# Patient Record
Sex: Female | Born: 1980 | Race: White | Hispanic: No | Smoking: Current every day smoker
Health system: Southern US, Community
[De-identification: ages and names within clinical notes are randomized; demographics above are authoritative.]

## PROBLEM LIST (undated history)

## (undated) HISTORY — PX: TUBAL LIGATION: SHX77

---

## 2018-08-30 NOTE — Progress Notes (Signed)
Patient's Name: Anne Chandler  MRN: [id number]  Referring Provider: Lysbeth Penner, FNP  DOB: 07-Aug-1980  PCP: Liliane Shi, MD  DOS: 08/31/2018  Note by: Anne Santa, MD  Service setting: Ambulatory outpatient  Specialty: Interventional Pain Management  Location: [facility] (AMB) Pain Management Facility  Visit type: Initial Patient Evaluation  Patient type: New Patient   Primary Reason(s) for Visit: Encounter for initial evaluation of one or more chronic problems (new to examiner) potentially causing chronic pain, and posing a threat to normal musculoskeletal function. (Level of risk: High) CC: Back Pain  HPI  Anne Chandler is a 38 y.o. year old, female patient, who comes today to see [place] for the first time for an initial evaluation of her chronic pain. She does not have a problem list on file. Today she comes in for evaluation of her Back Pain  Pain Assessment: Location:   Back Radiating: Pain radiaties down left to foot Onset: More than a month ago Duration: Chronic pain Quality: Tingling, Aching, Burning, Shooting, Numbness, Throbbing Severity: 3 /10 (subjective, self-reported pain score)  Note: Reported level is compatible with observation.                         When using our objective Pain Scale, levels between 6 and 10/10 are said to belong in an emergency room, as it progressively worsens from a 6/10, described as severely limiting, requiring emergency care not usually available at an outpatient pain management facility. At a 6/10 level, communication becomes difficult and requires great effort. Assistance to reach the emergency department may be required. Facial flushing and profuse sweating along with potentially dangerous increases in heart rate and blood pressure will be evident. Effect on ADL: pain cause me to stop doing anything Timing: Constant Modifying factors: steroid by mouth, ice and heat BP: 124/77  HR: (!) 105  Onset and Duration: Gradual and Date of onset: more than  1 month Cause of pain: Unknown Severity: Getting worse, NAS-11 at its worse: 10/10, NAS-11 at its best: 2/10, NAS-11 now: 3/10 and NAS-11 on the average: 3/10 Timing: Not influenced by the time of the day Aggravating Factors: Bending, Lifiting, Prolonged sitting, Prolonged standing and Walking Alleviating Factors: Cold packs, Medications and Warm showers or baths Associated Problems: Depression, Fatigue, Numbness, Tingling, Pain that wakes patient up and Pain that does not allow patient to sleep Quality of Pain: Burning, Constant, Sharp, Shooting and Uncomfortable Previous Examinations or Tests: MRI scan Previous Treatments: Epidural steroid injections, Narcotic medications and Steroid treatments by mouth  The patient comes into the clinics today for the first time for a chronic pain management evaluation.   38 year old female with a past medical history of hepatitis C presents with a chief complaint of low back pain with radiation down to her left lower extremity.  She finds it difficult to walk.  No inciting event.  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  She does not remember any sort of lifting trauma.  Patient's lumbar MRI shows L4-L5 left paracentral disc extrusion with cephalad migration of disc material and mass-effect on the left intraspinal L4 and L5 nerve roots.  Also shows mild bilateral facet arthropathy as well as left lateral recess stenosis.  Moderate foraminal stenosis.  She has been evaluated by urgent care.  She has received 2 steroid prednisone tapers.  She states that these prednisone tapers are very effective in decreasing her pain  and allowing her to function.  Is been 2 days since she has finished her steroid taper she states that her symptoms are returning.  She is being referred here for epidural steroid injection.  Patient states that she is also been utilizing oxycodone to help her breakthrough pain but it is not very effective.   I informed her that I do not plan on prescribing this medication given that long-term opioid therapy is not effective for acute radicular pain.  Denies any bowel or bladder dysfunction.  Denies physical therapy.  We discussed physical therapy, starting a membrane stabilizer and a diagnostic epidural steroid injection.  Patient in agreement with plan.   Historic Controlled Substance Pharmacotherapy Review   08/25/2018  1   08/25/2018  Oxycodon-Acetaminophen 7.5-325  20.00 3 [place] Ox[place]   [id number]   [place] ([id number])   0  75.00 MME  Medicaid   [place]   Short supply of oxycodone filled by urgent care, quantity 20 for acute radicular pain.    Historical Background Evaluation: [place] PMP: PDMP reviewed during this encounter. Six (6) year initial data search conducted.              [place] Department of public safety, offender search: Engineer, mining Information) Non-contributory Risk Assessment Profile: Aberrant behavior: None observed or detected today Risk factors for fatal opioid overdose: None identified today Fatal overdose hazard ratio (HR): Calculation deferred Non-fatal overdose hazard ratio (HR): Calculation deferred Risk of opioid abuse or dependence: 0.7-3.0% with doses ? 36 MME/day and 6.1-26% with doses ? 120 MME/day. Substance use disorder (SUD) risk level: See below Personal History of Substance Abuse (SUD-Substance use disorder):  Alcohol:    Illegal Drugs:    Rx Drugs:    ORT Risk Level calculation:    ORT Scoring interpretation table:  Score <3 = Low Risk for SUD  Score between 4-7 = Moderate Risk for SUD  Score >8 = High Risk for Opioid Abuse   PHQ-2 Depression Scale:  Total score:    PHQ-2 Scoring interpretation table: (Score and probability of major depressive disorder)  Score 0 = No depression  Score 1 = 15.4% Probability  Score 2 = 21.1% Probability  Score 3 = 38.4% Probability  Score 4 = 45.5% Probability  Score 5 = 56.4% Probability  Score 6 = 78.6% Probability   PHQ-9 Depression Scale:   Total score:    PHQ-9 Scoring interpretation table:  Score 0-4 = No depression  Score 5-9 = Mild depression  Score 10-14 = Moderate depression  Score 15-19 = Moderately severe depression  Score 20-27 = Severe depression (2.4 times higher risk of SUD and 2.89 times higher risk of overuse)   Pharmacologic Plan: No opioid analgesics.             Meds   Current Outpatient Medications:  .  oxycodone-acetaminophen (LYNOX) 7.5-300 MG tablet, Take 1 tablet by mouth every 6 (six) hours as needed for pain., Disp: , Rfl:  .  gabapentin (NEURONTIN) 300 MG capsule, 300 mg qhs for 5 days, if not side effects, increase to BID, Disp: 90 capsule, Rfl: 2 .  methylPREDNISolone (MEDROL) 4 MG TBPK tablet, Follow package instructions., Disp: 21 tablet, Rfl: 0  Imaging Review   Lumbar MRI 08/04/2018: Impression: 1.  At L4-5 there is a broad-based disc bulge with left paracentral disc extrusion with cephalad migration of disc material and mass-effect upon the left intraspinal L4-L5 nerve roots.  Mild bilateral facet arthropathy.  Left lateral recess stenosis.  Moderate left  foraminal stenosis.  Moderate foraminal stenosis.  Mild spinal stenosis. 2.  At L5-S1 there is a broad-based disc bulge with a small central disc protrusion.  Bilateral lateral recess narrowing and moderate right foraminal stenosis. 3.  At L3-L4 there is mild broad-based disc bulge with mild bilateral facet arthropathy and bilateral recess narrowing.  Complexity Note: Imaging results reviewed. Results shared with Anne Chandler, using Layman's terms.                         ROS  Cardiovascular: No reported cardiovascular signs or symptoms such as High blood pressure, coronary artery disease, abnormal heart rate or rhythm, heart attack, blood thinner therapy or heart weakness and/or failure Pulmonary or Respiratory: Smoking and Temporary stoppage of breathing during sleep Neurological: No reported neurological signs or symptoms such as seizures,  abnormal skin sensations, urinary and/or fecal incontinence, being born with an abnormal open spine and/or a tethered spinal cord Review of Past Neurological Studies: No results found for this or any previous visit. Psychological-Psychiatric: Depressed Gastrointestinal: No reported gastrointestinal signs or symptoms such as vomiting or evacuating blood, reflux, heartburn, alternating episodes of diarrhea and constipation, inflamed or scarred liver, or pancreas or irrregular and/or infrequent bowel movements Genitourinary: No reported renal or genitourinary signs or symptoms such as difficulty voiding or producing urine, peeing blood, non-functioning kidney, kidney stones, difficulty emptying the bladder, difficulty controlling the flow of urine, or chronic kidney disease Hematological: No reported hematological signs or symptoms such as prolonged bleeding, low or poor functioning platelets, bruising or bleeding easily, hereditary bleeding problems, low energy levels due to low hemoglobin or being anemic Endocrine: No reported endocrine signs or symptoms such as high or low blood sugar, rapid heart rate due to high thyroid levels, obesity or weight gain due to slow thyroid or thyroid disease Rheumatologic: No reported rheumatological signs and symptoms such as fatigue, joint pain, tenderness, swelling, redness, heat, stiffness, decreased range of motion, with or without associated rash Musculoskeletal: Negative for myasthenia gravis, muscular dystrophy, multiple sclerosis or malignant hyperthermia Work History: Working full time  Allergies  Anne Chandler has no allergies on file.   PFSH  Drug: Anne Chandler  has no history on file for drug. Alcohol:  has no history on file for alcohol. Tobacco:  reports that she has been smoking cigarettes. She uses smokeless tobacco. Medical:  has no past medical history on file. Family: family history is not on file.  Past Surgical History:  Procedure Laterality  Date  . TUBAL LIGATION     Active Ambulatory Problems    Diagnosis Date Noted  . No Active Ambulatory Problems   Resolved Ambulatory Problems    Diagnosis Date Noted  . No Resolved Ambulatory Problems   No Additional Past Medical History   Constitutional Exam  General appearance: Well nourished, well developed, and well hydrated. In no apparent acute distress Vitals:   08/31/18 1344  BP: 124/77  Pulse: (!) 105  Temp: 98.4 F (36.9 C)  SpO2: 99%  Weight: 165 lb (74.8 kg)  Height: 6' (1.829 m)   BMI Assessment: Estimated body mass index is 22.38 kg/m as calculated from the following:   Height as of this encounter: 6' (1.829 m).   Weight as of this encounter: 165 lb (74.8 kg).  BMI interpretation table: BMI level Category Range association with higher incidence of chronic pain  <18 kg/m2 Underweight   18.5-24.9 kg/m2 Ideal body weight   25-29.9 kg/m2 Overweight Increased incidence  by 20%  30-34.9 kg/m2 Obese (Class I) Increased incidence by 68%  35-39.9 kg/m2 Severe obesity (Class II) Increased incidence by 136%  >40 kg/m2 Extreme obesity (Class III) Increased incidence by 254%   Patient's current BMI Ideal Body weight  Body mass index is 22.38 kg/m. Ideal body weight: 73.1 kg (161 lb 2.5 oz) Adjusted ideal body weight: 73.8 kg (162 lb 11.1 oz)   BMI Readings from Last 4 Encounters:  08/31/18 22.38 kg/m   Wt Readings from Last 4 Encounters:  08/31/18 165 lb (74.8 kg)  Psych/Mental status: Alert, oriented x 3 (person, place, & time)       Eyes: PERLA Respiratory: No evidence of acute respiratory distress  Cervical Spine Area Exam  Skin & Axial Inspection: No masses, redness, edema, swelling, or associated skin lesions Alignment: Symmetrical Functional ROM: Unrestricted ROM      Stability: No instability detected Muscle Tone/Strength: Functionally intact. No obvious neuro-muscular anomalies detected. Sensory (Neurological): Unimpaired  Thoracic Spine Area Exam   Skin & Axial Inspection: No masses, redness, or swelling Alignment: Symmetrical Functional ROM: Unrestricted ROM Stability: No instability detected Muscle Tone/Strength: Functionally intact. No obvious neuro-muscular anomalies detected. Sensory (Neurological): Unimpaired Muscle strength & Tone: No palpable anomalies  Lumbar Spine Area Exam  Skin & Axial Inspection: No masses, redness, or swelling Alignment: Symmetrical Functional ROM: Decreased ROM affecting primarily the left Stability: No instability detected Muscle Tone/Strength: Functionally intact. No obvious neuro-muscular anomalies detected. Sensory (Neurological): Dermatomal pain pattern left L5, partial L4  Provocative Tests: Hyperextension/rotation test: (+) bilaterally for facet joint pain. Lumbar quadrant test (Kemp's test): (+) on the left for foraminal stenosis Lateral bending test: (+) ipsilateral radicular pain, on the left. Positive for left-sided foraminal stenosis. Patrick's Maneuver: deferred today                   FABER* test: deferred today                   S-I anterior distraction/compression test: deferred today         S-I lateral compression test: deferred today         S-I Thigh-thrust test: deferred today         S-I Gaenslen's test: deferred today         *(Flexion, ABduction and External Rotation)  Gait & Posture Assessment  Ambulation: Unassisted Gait: Relatively normal for age and body habitus Posture: WNL   Lower Extremity Exam    Side: Right lower extremity  Side: Left lower extremity  Stability: No instability observed          Stability: No instability observed          Skin & Extremity Inspection: Skin color, temperature, and hair growth are WNL. No peripheral edema or cyanosis. No masses, redness, swelling, asymmetry, or associated skin lesions. No contractures.  Skin & Extremity Inspection: Skin color, temperature, and hair growth are WNL. No peripheral edema or cyanosis. No masses,  redness, swelling, asymmetry, or associated skin lesions. No contractures.  Functional ROM: Unrestricted ROM                  Functional ROM: Pain restricted ROM for hip and knee joints          Muscle Tone/Strength: Functionally intact. No obvious neuro-muscular anomalies detected.  Muscle Tone/Strength: Functionally intact. No obvious neuro-muscular anomalies detected.  Sensory (Neurological): Unimpaired        Sensory (Neurological): Dermatomal pain pattern: L4-L5  DTR: Patellar: 2+: normal Achilles: deferred today Plantar: deferred today  DTR: Patellar: 2+: normal Achilles: deferred today Plantar: deferred today  Palpation: No palpable anomalies  Palpation: No palpable anomalies   Assessment  Primary Diagnosis & Pertinent Problem List: The primary encounter diagnosis was Lumbar radiculopathy. A diagnosis of Lumbar radicular syndrome was also pertinent to this visit.  Visit Diagnosis (New problems to examiner): 1. Lumbar radiculopathy   2. Lumbar radicular syndrome    Plan of Care (Initial workup plan)  General Recommendations: The pain condition that the patient suffers from is best treated with a multidisciplinary approach that involves an increase in physical activity to prevent de-conditioning and worsening of the pain cycle, as well as psychological counseling (formal and/or informal) to address the co-morbid psychological affects of pain. Treatment will often involve judicious use of pain medications and interventional procedures to decrease the pain, allowing the patient to participate in the physical activity that will ultimately produce long-lasting pain reductions. The goal of the multidisciplinary approach is to return the patient to a higher level of overall function and to restore their ability to perform activities of daily living.  38 year old female with a past medical history of hepatitis C presents with a chief complaint of low back pain with radiation down to her  left lower extremity.  She finds it difficult to walk.  No inciting event.  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  She does not remember any sort of lifting trauma.  Patient's lumbar MRI shows L4-L5 left paracentral disc extrusion with cephalad migration of disc material and mass-effect on the left intraspinal L4 and L5 nerve roots.  Also shows mild bilateral facet arthropathy as well as left lateral recess stenosis.  Moderate foraminal stenosis.  She has been evaluated by urgent care.  She has received 2 steroid prednisone tapers.  She states that these prednisone tapers are very effective in decreasing her pain and allowing her to function.  Is been 2 days since she has finished her steroid taper she states that her symptoms are returning.  She is being referred here for epidural steroid injection.  Patient states that she is also been utilizing oxycodone to help her breakthrough pain but it is not very effective.  I informed her that I do not plan on prescribing this medication given that long-term opioid therapy is not effective for acute radicular pain.  Denies any bowel or bladder dysfunction.  Denies physical therapy.  We discussed physical therapy, starting a membrane stabilizer and a diagnostic epidural steroid injection.  Patient in agreement with plan.  Plan: -Had extensive discussion with patient regarding evidence-based therapies for acute radiculopathy.  I informed the patient that these usually tend to get better on their own.  Physical therapy can be helpful.  I will place a referral.  We also discussed the addition of a neuropathic membrane stabilizer.  Patient denies having been on gabapentin.  We will start as below.  Patient is also requesting a steroid taper.  She is already had 2 in the past over the last month.  Encouraged the patient to try and avoid steroid medications until her epidural steroid injection-patient was still insistent.  I will  send in a prescription for methylprednisolone instructed patient to only fill if she if she having a very severe pain flare.  I informed her that gabapentin should help with her radicular pain symptoms.  We also discussed diagnostic lumbar steroid injection.  Risks and benefits reviewed and patient like to proceed.  I was very clear with the patient that opioid therapy would not be a part of her therapeutic plan as this is acute pain related to lumbar radiculopathy which will be more responsive to physical therapy, membrane stabilizer and diagnostic ESI.     Referral Orders     Ambulatory referral to Physical Therapy  Procedure Orders     Lumbar Epidural Injection Pharmacotherapy (current): Medications ordered:  Meds ordered this encounter  Medications  . gabapentin (NEURONTIN) 300 MG capsule    Sig: 300 mg qhs for 5 days, if not side effects, increase to BID    Dispense:  90 capsule    Refill:  2  . methylPREDNISolone (MEDROL) 4 MG TBPK tablet    Sig: Follow package instructions.    Dispense:  21 tablet    Refill:  0    Do not add to the "Automatic Refill" notification system.  . orphenadrine (NORFLEX) injection 30 mg  . ketorolac (TORADOL) 30 MG/ML injection 30 mg   Medications administered during this visit: We administered orphenadrine and ketorolac.   Pharmacological management options:  Opioid Analgesics: N/A  Membrane stabilizer: Trial of gabapentin.  Can consider Lyrica or Cymbalta  Muscle relaxant: To be determined at a later time  NSAID: To be determined at a later time  Other analgesic(s): To be determined at a later time   Interventional management options: Anne Chandler was informed that there is no guarantee that she would be a candidate for interventional therapies. The decision will be based on the results of diagnostic studies, as well as Anne Chandler's risk profile.  Procedure(s) under consideration:  L4-L5 ESI   Provider-requested follow-up: Return in about 5  days (around 09/05/2018) for Procedure .  Future Appointments  Date Time Provider Department Center  09/05/2018  9:45 AM Anne Jolly, MD [facility] None    Primary Care Physician: Anne Kand, MD Location: [facility] Note by: Anne Jolly, MD Date: 08/31/2018; Time: 8:40 AM  Note: This dictation was prepared with Dragon dictation. Any transcriptional errors that may result from this process are unintentional.

## 2018-08-31 ENCOUNTER — Ambulatory Visit
Payer: Medicaid Other | Attending: Student in an Organized Health Care Education/Training Program | Admitting: Student in an Organized Health Care Education/Training Program

## 2018-08-31 ENCOUNTER — Encounter: Payer: Self-pay | Admitting: Student in an Organized Health Care Education/Training Program

## 2018-08-31 ENCOUNTER — Other Ambulatory Visit: Payer: Self-pay

## 2018-08-31 VITALS — BP 124/77 | HR 105 | Temp 98.4°F | Ht 72.0 in | Wt 165.0 lb

## 2018-08-31 DIAGNOSIS — M5442 Lumbago with sciatica, left side: Secondary | ICD-10-CM | POA: Diagnosis present

## 2018-08-31 DIAGNOSIS — M5416 Radiculopathy, lumbar region: Secondary | ICD-10-CM | POA: Insufficient documentation

## 2018-08-31 MED ORDER — METHYLPREDNISOLONE 4 MG PO TBPK: ORAL_TABLET | ORAL | 0 refills | Status: AC

## 2018-08-31 MED ORDER — GABAPENTIN 300 MG PO CAPS: ORAL_CAPSULE | ORAL | 2 refills | Status: DC

## 2018-08-31 MED ORDER — ORPHENADRINE CITRATE 30 MG/ML IJ SOLN
30.0000 mg | Freq: Once | INTRAMUSCULAR | Status: AC
  Administered 2018-08-31: 30 mg via INTRAMUSCULAR

## 2018-08-31 MED ORDER — KETOROLAC TROMETHAMINE 30 MG/ML IJ SOLN
30.0000 mg | Freq: Once | INTRAMUSCULAR | Status: AC
  Administered 2018-08-31: 15:00:00 30 mg via INTRAMUSCULAR

## 2018-08-31 MED ORDER — ORPHENADRINE CITRATE 30 MG/ML IJ SOLN
INTRAMUSCULAR | Status: AC
  Filled 2018-08-31: qty 2

## 2018-08-31 MED ORDER — KETOROLAC TROMETHAMINE 30 MG/ML IJ SOLN
INTRAMUSCULAR | Status: AC
  Filled 2018-08-31: qty 1

## 2018-09-01 DIAGNOSIS — M5416 Radiculopathy, lumbar region: Secondary | ICD-10-CM | POA: Insufficient documentation

## 2018-09-01 DIAGNOSIS — M5442 Lumbago with sciatica, left side: Secondary | ICD-10-CM | POA: Insufficient documentation

## 2018-09-05 ENCOUNTER — Ambulatory Visit
Admission: RE | Admit: 2018-09-05 | Discharge: 2018-09-05 | Disposition: A | Discharge status: Home / Self Care | Payer: Medicaid Other | Source: Ambulatory Visit | Attending: Student in an Organized Health Care Education/Training Program | Admitting: Student in an Organized Health Care Education/Training Program

## 2018-09-05 ENCOUNTER — Encounter: Payer: Self-pay | Admitting: Student in an Organized Health Care Education/Training Program

## 2018-09-05 ENCOUNTER — Other Ambulatory Visit: Payer: Self-pay

## 2018-09-05 ENCOUNTER — Ambulatory Visit (HOSPITAL_BASED_OUTPATIENT_CLINIC_OR_DEPARTMENT_OTHER): Payer: Medicaid Other | Admitting: Student in an Organized Health Care Education/Training Program

## 2018-09-05 DIAGNOSIS — M5416 Radiculopathy, lumbar region: Secondary | ICD-10-CM | POA: Diagnosis not present

## 2018-09-05 MED ORDER — LIDOCAINE HCL 2 % IJ SOLN
20.0000 mL | Freq: Once | INTRAMUSCULAR | Status: AC
  Administered 2018-09-05: 13:00:00 400 mg
  Filled 2018-09-05: qty 20

## 2018-09-05 MED ORDER — DEXAMETHASONE SODIUM PHOSPHATE 10 MG/ML IJ SOLN
10.0000 mg | Freq: Once | INTRAMUSCULAR | Status: AC
  Administered 2018-09-05: 10 mg
  Filled 2018-09-05: qty 1

## 2018-09-05 MED ORDER — IOHEXOL 180 MG/ML  SOLN: 10.0000 mL | Freq: Once | INTRAMUSCULAR | Status: DC

## 2018-09-05 MED ORDER — ROPIVACAINE HCL 2 MG/ML IJ SOLN
1.0000 mL | Freq: Once | INTRAMUSCULAR | Status: DC
  Filled 2018-09-05: qty 10

## 2018-09-05 MED ORDER — SODIUM CHLORIDE 0.9% FLUSH
1.0000 mL | Freq: Once | INTRAVENOUS | Status: AC
  Administered 2018-09-05: 13:00:00 10 mL

## 2018-09-05 NOTE — Patient Instructions (Signed)

## 2018-09-05 NOTE — Progress Notes (Signed)
Patient's Name: Anne Chandler  MRN: [id number]  Referring Provider: Edward JollyLateef, Makari Sanko, MD  DOB: 11/10/1980  PCP: Binnie KandAnderson, Jessica, MD  DOS: 09/05/2018  Note by: Edward JollyBilal Savien Mamula, MD  Service setting: Ambulatory outpatient  Specialty: Interventional Pain Management  Patient type: Established  Location: [facility] (AMB) Pain Management Facility  Visit type: Interventional Procedure   Primary Reason for Visit: Interventional Pain Management Treatment. CC: Back Pain  Procedure:          Anesthesia, Analgesia, Anxiolysis:  Type: Diagnostic Inter-Laminar Epidural Steroid Injection  #1  Region: Lumbar Level: L4-5 Level. Laterality: Left-Sided         Type: Local Anesthesia  Local Anesthetic: Lidocaine 1-2%  Position: Prone with head of the table was raised to facilitate breathing.   Indications: 1. Lumbar radiculopathy    Pain Score: Pre-procedure: 3 /10 Post-procedure: 3 /10   Pre-op Assessment:  Anne Chandler is a 38 y.o. (year old), female patient, seen today for interventional treatment. She  has a past surgical history that includes Tubal ligation. Anne Chandler has a current medication list which includes the following prescription(s): gabapentin, methylprednisolone, and oxycodone-acetaminophen, and the following Facility-Administered Medications: iohexol and ropivacaine (pf) 2 mg/ml (0.2%). Her primarily concern today is the Back Pain  Initial Vital Signs:  Pulse/HCG Rate: 97ECG Heart Rate: 88 Temp: 98 F (36.7 C) Resp: 12 BP: 133/79 SpO2: 97 %  BMI: Estimated body mass index is 26.63 kg/m as calculated from the following:   Height as of this encounter: 5\' 6"  (1.676 m).   Weight as of this encounter: 165 lb (74.8 kg).  Risk Assessment: Allergies: Reviewed. She has No Known Allergies.  Allergy Precautions: None required Coagulopathies: Reviewed. None identified.  Blood-thinner therapy: None at this time Active Infection(s): Reviewed. None identified. Anne Chandler is afebrile  Site  Confirmation: Anne Chandler was asked to confirm the procedure and laterality before marking the site Procedure checklist: Completed Consent: Before the procedure and under the influence of no sedative(s), amnesic(s), or anxiolytics, the patient was informed of the treatment options, risks and possible complications. To fulfill our ethical and legal obligations, as recommended by the American Medical Association's Code of Ethics, I have informed the patient of my clinical impression; the nature and purpose of the treatment or procedure; the risks, benefits, and possible complications of the intervention; the alternatives, including doing nothing; the risk(s) and benefit(s) of the alternative treatment(s) or procedure(s); and the risk(s) and benefit(s) of doing nothing. The patient was provided information about the general risks and possible complications associated with the procedure. These may include, but are not limited to: failure to achieve desired goals, infection, bleeding, organ or nerve damage, allergic reactions, paralysis, and death. In addition, the patient was informed of those risks and complications associated to Spine-related procedures, such as failure to decrease pain; infection (i.e.: Meningitis, epidural or intraspinal abscess); bleeding (i.e.: epidural hematoma, subarachnoid hemorrhage, or any other type of intraspinal or peri-dural bleeding); organ or nerve damage (i.e.: Any type of peripheral nerve, nerve root, or spinal cord injury) with subsequent damage to sensory, motor, and/or autonomic systems, resulting in permanent pain, numbness, and/or weakness of one or several areas of the body; allergic reactions; (i.e.: anaphylactic reaction); and/or death. Furthermore, the patient was informed of those risks and complications associated with the medications. These include, but are not limited to: allergic reactions (i.e.: anaphylactic or anaphylactoid reaction(s)); adrenal axis suppression;  blood sugar elevation that in diabetics may result in ketoacidosis or comma; water retention that in patients  with history of congestive heart failure may result in shortness of breath, pulmonary edema, and decompensation with resultant heart failure; weight gain; swelling or edema; medication-induced neural toxicity; particulate matter embolism and blood vessel occlusion with resultant organ, and/or nervous system infarction; and/or aseptic necrosis of one or more joints. Finally, the patient was informed that Medicine is not an exact science; therefore, there is also the possibility of unforeseen or unpredictable risks and/or possible complications that may result in a catastrophic outcome. The patient indicated having understood very clearly. We have given the patient no guarantees and we have made no promises. Enough time was given to the patient to ask questions, all of which were answered to the patient's satisfaction. Anne Chandler has indicated that she wanted to continue with the procedure. Attestation: I, the ordering provider, attest that I have discussed with the patient the benefits, risks, side-effects, alternatives, likelihood of achieving goals, and potential problems during recovery for the procedure that I have provided informed consent. Date  Time: 09/05/2018 11:52 AM  Pre-Procedure Preparation:  Monitoring: As per clinic protocol. Respiration, ETCO2, SpO2, BP, heart rate and rhythm monitor placed and checked for adequate function Safety Precautions: Patient was assessed for positional comfort and pressure points before starting the procedure. Time-out: I initiated and conducted the "Time-out" before starting the procedure, as per protocol. The patient was asked to participate by confirming the accuracy of the "Time Out" information. Verification of the correct person, site, and procedure were performed and confirmed by me, the nursing staff, and the patient. "Time-out" conducted as per Joint  Commission's Universal Protocol (UP.01.01.01). Time: 1231  Description of Procedure:          Target Area: The interlaminar space, initially targeting the lower laminar border of the superior vertebral body. Approach: Paramedial approach. Area Prepped: Entire Posterior Lumbar Region Prepping solution: DuraPrep (Iodine Povacrylex [0.7% available iodine] and Isopropyl Alcohol, 74% w/w) Safety Precautions: Aspiration looking for blood return was conducted prior to all injections. At no point did we inject any substances, as a needle was being advanced. No attempts were made at seeking any paresthesias. Safe injection practices and needle disposal techniques used. Medications properly checked for expiration dates. SDV (single dose vial) medications used. Description of the Procedure: Protocol guidelines were followed. The procedure needle was introduced through the skin, ipsilateral to the reported pain, and advanced to the target area. Bone was contacted and the needle walked caudad, until the lamina was cleared. The epidural space was identified using "loss-of-resistance technique" with 2-3 ml of PF-NaCl (0.9% NSS), in a 5cc LOR glass syringe.  Vitals:   09/05/18 1154 09/05/18 1235 09/05/18 1240  BP: 133/79 (!) 129/92 128/90  Pulse: 97    Resp:  12 14  Temp: 98 F (36.7 C)    SpO2: 97% 98% 98%  Weight: 165 lb (74.8 kg)    Height: 5\' 6"  (1.676 m)      Start Time: 1231 hrs. End Time: 1240 hrs.  Materials:  Needle(s) Type: Epidural needle Gauge: 17G Length: 3.5-in Medication(s): Please see orders for medications and dosing details. 8 cc solution made of 5 cc of preservative-free saline, 2 cc of 0.2% ropivacaine, 1 cc of Decadron 10 mg/cc.  Imaging Guidance (Spinal):          Type of Imaging Technique: Fluoroscopy Guidance (Spinal) Indication(s): Assistance in needle guidance and placement for procedures requiring needle placement in or near specific anatomical locations not easily  accessible without such assistance. Exposure Time: Please see nurses  notes. Contrast: Before injecting any contrast, we confirmed that the patient did not have an allergy to iodine, shellfish, or radiological contrast. Once satisfactory needle placement was completed at the desired level, radiological contrast was injected. Contrast injected under live fluoroscopy. No contrast complications. See chart for type and volume of contrast used. Fluoroscopic Guidance: I was personally present during the use of fluoroscopy. "Tunnel Vision Technique" used to obtain the best possible view of the target area. Parallax error corrected before commencing the procedure. "Direction-depth-direction" technique used to introduce the needle under continuous pulsed fluoroscopy. Once target was reached, antero-posterior, oblique, and lateral fluoroscopic projection used confirm needle placement in all planes. Images permanently stored in EMR. Interpretation: I personally interpreted the imaging intraoperatively. Adequate needle placement confirmed in multiple planes. Appropriate spread of contrast into desired area was observed. No evidence of afferent or efferent intravascular uptake. No intrathecal or subarachnoid spread observed. Permanent images saved into the patient's record.  Antibiotic Prophylaxis:   Anti-infectives (From admission, onward)   None     Indication(s): None identified  Post-operative Assessment:  Post-procedure Vital Signs:  Pulse/HCG Rate: 9786 Temp: 98 F (36.7 C) Resp: 14 BP: 128/90 SpO2: 98 %  EBL: None  Complications: No immediate post-treatment complications observed by team, or reported by patient.  Note: The patient tolerated the entire procedure well. A repeat set of vitals were taken after the procedure and the patient was kept under observation following institutional policy, for this type of procedure. Post-procedural neurological assessment was performed, showing return to  baseline, prior to discharge. The patient was provided with post-procedure discharge instructions, including a section on how to identify potential problems. Should any problems arise concerning this procedure, the patient was given instructions to immediately contact us, at any time, without hesitation. In any case, we plan to contact the patient by telephone for a follow-up status report regarding this interventional procedure.  Comments:  No additional relevant information. 5 out of 5 strength bilateral lower extremity: Plantar flexion, dorsiflexion, knee flexion, knee extension.  Plan of Care  Orders:  Orders Placed This Encounter  Procedures  . DG PAIN CLINIC C-ARM 1-60 MIN NO REPORT    Intraoperative interpretation [facility] procedural [facility].    Standing Status:   Standing    Number of Occurrences:   1    Order Specific Question:   Reason for exam:    Answer:   Assistance in needle guidance and placement for procedures requiring needle placement in or near specific anatomical locations not easily accessible without such assistance.    Medications ordered for procedure: Meds ordered this encounter  Medications  . iohexol (OMNIPAQUE) 180 MG/ML injection 10 mL    Must be Myelogram-compatible. If not available, you may substitute with a water-soluble, non-ionic, hypoallergenic, myelogram-compatible radiological contrast medium.  Marland Kitchen. lidocaine (XYLOCAINE) 2 % (with pres) injection 400 mg  . ropivacaine (PF) 2 mg/mL (0.2%) (NAROPIN) injection 1 mL  . dexamethasone (DECADRON) injection 10 mg  . sodium chloride flush (NS) 0.9 % injection 1 mL   Medications administered: We administered lidocaine, dexamethasone, and sodium chloride flush.  See the medical record for exact dosing, route, and time of administration.  Follow-up plan:   Return in about 4 weeks (around 10/03/2018) for Post Procedure Evaluation, virtual.      Status post left L4-L5 ESI 09/05/2018   Recent  Visits Date Type Provider Dept  08/31/18 Office Visit Edward JollyLateef, Christeena Krogh, MD [facility]  Showing recent visits within past  90 days and meeting all other requirements   Today's Visits Date Type Provider Dept  09/05/18 Procedure visit Gillis Santa, MD [facility]  Showing today's visits and meeting all other requirements   Future Appointments Date Type Provider Dept  10/04/18 Appointment Gillis Santa, MD [facility]  Showing future appointments within next 90 days and meeting all other requirements   Disposition: Discharge home  Discharge Date & Time: 09/05/2018; 1250 hrs.   Primary Care Physician: Liliane Shi, MD Location: [facility] Note by: Gillis Santa, MD Date: 09/05/2018; Time: 1:17 PM  Disclaimer:  Medicine is not an exact science. The only guarantee in medicine is that nothing is guaranteed. It is important to note that the decision to proceed with this intervention was based on the information collected from the patient. The Data and conclusions were drawn from the patient's questionnaire, the interview, and the physical examination. Because the information was provided in large part by the patient, it cannot be guaranteed that it has not been purposely or unconsciously manipulated. Every effort has been made to obtain as much relevant data as possible for this evaluation. It is important to note that the conclusions that lead to this procedure are derived in large part from the available data. Always take into account that the treatment will also be dependent on availability of resources and existing treatment guidelines, considered by other Pain Management Practitioners as being common knowledge and practice, at the time of the intervention. For Medico-Legal purposes, it is also important to point out that variation in procedural techniques and pharmacological choices are the acceptable norm. The indications,  contraindications, technique, and results of the above procedure should only be interpreted and judged by a Board-Certified Interventional Pain Specialist with extensive familiarity and expertise in the same exact procedure and technique.

## 2018-09-06 ENCOUNTER — Telehealth: Payer: Self-pay

## 2018-09-06 NOTE — Telephone Encounter (Signed)
Post procedure phone call.  LM 

## 2018-09-14 ENCOUNTER — Ambulatory Visit: Payer: Medicaid Other | Attending: Physical Therapy | Admitting: Physical Therapy

## 2018-09-22 ENCOUNTER — Telehealth: Payer: Self-pay | Admitting: Student in an Organized Health Care Education/Training Program

## 2018-09-22 ENCOUNTER — Telehealth: Payer: Self-pay | Admitting: *Deleted

## 2018-09-22 MED ORDER — METHYLPREDNISOLONE 4 MG PO TBPK: ORAL_TABLET | ORAL | 0 refills | Status: DC

## 2018-09-22 NOTE — Telephone Encounter (Signed)
Paieint called and is requesting pain medication along with the MEDROL dose pack you prescribed. I told her this was not possible via phone and to try the dose pack. Now she wants to know if she can take 800mg  ibuprofen with the steroid dose pack? Please advise.

## 2018-09-22 NOTE — Telephone Encounter (Signed)
Per Dr. Holley Raring, she can take Ibuprofen with the steroid dose pack. Patient called and informed.

## 2018-09-22 NOTE — Telephone Encounter (Signed)
Called and LVM informing of med called in as requested.

## 2018-09-22 NOTE — Telephone Encounter (Signed)
Patient is having pain again and wanted to know if you could call in a script of steroids to help until she can get back in for a procedure.

## 2018-09-26 ENCOUNTER — Telehealth: Payer: Self-pay

## 2018-09-26 NOTE — Telephone Encounter (Signed)
Pt called and requesting to speak with Dr Holley Raring. She is having some major issues, She went to Er in [place] over the weekend. She is unable to walk at all and is having concerns. She has Virtual Appt Wed but want's to speak to someone before appt.

## 2018-09-26 NOTE — Telephone Encounter (Signed)
Spoke with patient. States that she is incapacitated.  States the steroids that Dr Holley Raring gave her did not work.  States she has gone to the ED and gotten steroids via IV, muscle relacxers, roxicodone and nothing is helping.  States she cant walk by herself or do anything but lie on the floor.  States she is having the exact pain she had prior to the [procedure but it is 1000 times worse.  States she got good relief from Epidural up until last week.  Denies fever, inability to control bowel or bladder.  Dr Holley Raring notified.  Patient offerred another Elidural on Wednesday which she was given the appointment.  States she would like sedation.  instructions given for sedation.   Anne Chandler notified to schedule for 130 on Wednesday 09-28-18

## 2018-09-28 ENCOUNTER — Ambulatory Visit
Admission: RE | Admit: 2018-09-28 | Discharge: 2018-09-28 | Disposition: A | Discharge status: Home / Self Care | Payer: Medicaid Other | Source: Ambulatory Visit | Attending: Student in an Organized Health Care Education/Training Program | Admitting: Student in an Organized Health Care Education/Training Program

## 2018-09-28 ENCOUNTER — Ambulatory Visit (HOSPITAL_BASED_OUTPATIENT_CLINIC_OR_DEPARTMENT_OTHER): Payer: Medicaid Other | Admitting: Student in an Organized Health Care Education/Training Program

## 2018-09-28 ENCOUNTER — Encounter: Payer: Self-pay | Admitting: Student in an Organized Health Care Education/Training Program

## 2018-09-28 ENCOUNTER — Other Ambulatory Visit: Payer: Self-pay

## 2018-09-28 VITALS — BP 219/88 | HR 86 | Temp 98.3°F | Resp 20 | Ht 66.0 in | Wt 165.0 lb

## 2018-09-28 DIAGNOSIS — M5442 Lumbago with sciatica, left side: Secondary | ICD-10-CM | POA: Insufficient documentation

## 2018-09-28 DIAGNOSIS — Z79899 Other long term (current) drug therapy: Secondary | ICD-10-CM | POA: Diagnosis present

## 2018-09-28 DIAGNOSIS — M5416 Radiculopathy, lumbar region: Secondary | ICD-10-CM | POA: Insufficient documentation

## 2018-09-28 MED ORDER — GABAPENTIN 300 MG PO CAPS: ORAL_CAPSULE | ORAL | 2 refills | Status: DC

## 2018-09-28 MED ORDER — FENTANYL CITRATE (PF) 100 MCG/2ML IJ SOLN
25.0000 ug | INTRAMUSCULAR | Status: DC | PRN
  Administered 2018-09-28: 15:00:00 100 ug via INTRAVENOUS

## 2018-09-28 MED ORDER — LIDOCAINE HCL 2 % IJ SOLN
INTRAMUSCULAR | Status: AC
  Filled 2018-09-28: qty 20

## 2018-09-28 MED ORDER — DEXAMETHASONE SODIUM PHOSPHATE 10 MG/ML IJ SOLN
10.0000 mg | Freq: Once | INTRAMUSCULAR | Status: AC
  Administered 2018-09-28: 15:00:00 10 mg

## 2018-09-28 MED ORDER — ROPIVACAINE HCL 2 MG/ML IJ SOLN
INTRAMUSCULAR | Status: AC
  Filled 2018-09-28: qty 10

## 2018-09-28 MED ORDER — IOHEXOL 180 MG/ML  SOLN
10.0000 mL | Freq: Once | INTRAMUSCULAR | Status: AC
  Administered 2018-09-28: 15:00:00 10 mL via EPIDURAL

## 2018-09-28 MED ORDER — DEXAMETHASONE SODIUM PHOSPHATE 10 MG/ML IJ SOLN
INTRAMUSCULAR | Status: AC
  Filled 2018-09-28: qty 1

## 2018-09-28 MED ORDER — FENTANYL CITRATE (PF) 100 MCG/2ML IJ SOLN
INTRAMUSCULAR | Status: AC
  Filled 2018-09-28: qty 2

## 2018-09-28 MED ORDER — PREDNISONE 20 MG PO TABS: ORAL_TABLET | ORAL | 0 refills | Status: AC

## 2018-09-28 MED ORDER — SODIUM CHLORIDE (PF) 0.9 % IJ SOLN
INTRAMUSCULAR | Status: AC
  Filled 2018-09-28: qty 10

## 2018-09-28 MED ORDER — ROPIVACAINE HCL 2 MG/ML IJ SOLN
1.0000 mL | Freq: Once | INTRAMUSCULAR | Status: AC
  Administered 2018-09-28: 15:00:00 10 mL via EPIDURAL

## 2018-09-28 MED ORDER — SODIUM CHLORIDE 0.9% FLUSH
1.0000 mL | Freq: Once | INTRAVENOUS | Status: AC
  Administered 2018-09-28: 15:00:00 10 mL

## 2018-09-28 NOTE — Progress Notes (Signed)
Safety precautions to be maintained throughout the outpatient stay will include: orient to surroundings, keep bed in low position, maintain call bell within reach at all times, provide assistance with transfer out of bed and ambulation.  

## 2018-09-28 NOTE — Patient Instructions (Signed)
Increase Gabapentin to 3 times per day. Take steroid dosepack in about 5 days if no improvement in pain.  ____________________________________________________________________________________________  Post-procedure Information What to expect: Most procedures involve the use of a local anesthetic (numbing medicine), and a steroid (anti-inflammatory medicine).  The local anesthetics may cause temporary numbness and weakness of the legs or arms, depending on the location of the block. This numbness/weakness may last 4-6 hours, depending on the local anesthetic used. In rare instances, it can last up to 24 hours. While numb, you must be very careful not to injure the extremity.  After any procedure, you could expect the pain to get better within 15-20 minutes. This relief is temporary and may last 4-6 hours. Once the local anesthetics wears off, you could experience discomfort, possibly more than usual, for up to 10 (ten) days. In the case of radiofrequencies, it may last up to 6 weeks. Surgeries may take up to 8 weeks for the healing process. The discomfort is due to the irritation caused by needles going through skin and muscle. To minimize the discomfort, we recommend using ice the first day, and heat from then on. The ice should be applied for 15 minutes on, and 15 minutes off. Keep repeating this cycle until bedtime. Avoid applying the ice directly to the skin, to prevent frostbite. Heat should be used daily, until the pain improves (4-10 days). Be careful not to burn yourself.  Occasionally you may experience muscle spasms or cramps. These occur as a consequence of the irritation caused by the needle sticks to the muscle and the blood that will inevitably be lost into the surrounding muscle tissue. Blood tends to be very irritating to tissues, which tend to react by going into spasm. These spasms may start the same day of your procedure, but they may also take days to develop. This late onset type of  spasm or cramp is usually caused by electrolyte imbalances triggered by the steroids, at the level of the kidney. Cramps and spasms tend to respond well to muscle relaxants, multivitamins (some are triggered by the procedure, but may have their origins in vitamin deficiencies), and "Gatorade", or any sports drinks that can replenish any electrolyte imbalances. (If you are a diabetic, ask your pharmacist to get you a sugar-free brand.) Warm showers or baths may also be helpful. Stretching exercises are highly recommended.  General Instructions:  Be alert for signs of possible infection: redness, swelling, heat, red streaks, elevated temperature, and/or fever. These typically appear 4 to 6 days after the procedure. Immediately notify your doctor if you experience unusual bleeding, difficulty breathing, or loss of bowel or bladder control. If you experience increased pain, do not increase your pain medicine intake, unless instructed by your pain physician.  Post-Procedure Care:  Be careful in moving about. Muscle spasms in the area of the injection may occur. Applying ice or heat to the area is often helpful. The incidence of spinal headaches after epidural injections ranges between 1.4% and 6%. If you develop a headache that does not seem to respond to conservative therapy, please let your physician know. This can be treated with an epidural blood patch.   Post-procedure numbness or redness is to be expected, however it should average 4 to 6 hours. If numbness and weakness of your extremities begins to develop 4 to 6 hours after your procedure, and is felt to be progressing and worsening, immediately contact your physician.  Diet:  If you experience nausea, do not eat until this  sensation goes away. If you had a "Stellate Ganglion Block" for upper extremity "Reflex Sympathetic Dystrophy", do not eat or drink until your hoarseness goes away. In any case, always start with liquids first and if you tolerate  them well, then slowly progress to more solid foods.  Activity:  For the first 4 to 6 hours after the procedure, use caution in moving about as you may experience numbness and/or weakness. Use caution in cooking, using household electrical appliances, and climbing steps. If you need to reach your Doctor call our office: [phone] (During business hours) or [phone] (After business hours).  Business Hours: Monday-Thursday 8:00 am - 4:00 PM    Fridays: Closed     In case of an emergency: In case of emergency, call 911 or go to the nearest emergency room and have the physician there call us.  Interpretation of Procedure Every nerve block has two components: a diagnostic component, and a treatment component. Unrealistic expectations are the most common causes of "perceived failure".  In a perfect world, a single nerve block should be able to completely and permanently eliminate the pain. Sadly, the world is not perfect.  Most pain management nerve blocks are performed using local anesthetics and steroids. Steroids are responsible for any long-term benefit that you may experience. Their purpose is to decrease any chronic swelling that may exist in the area. Steroids begin to work immediately after being injected. However, most patients will not experience any benefits until 5 to 10 days after the injection, when the swelling has come down to the point where they can tell a difference. Steroids will only help if there is swelling to be treated. As such, they can assist with the diagnosis. If effective, they suggest an inflammatory component to the pain, and if ineffective, they rule out inflammation as the main cause or component of the problem. If the problem is one of mechanical compression, you will get no benefit from those steroids.   In the case of local anesthetics, they have a crucial role in the diagnosis of your condition. Most will begin to work within15 to 20 minutes after  injection. The duration will depend on the type used (short- vs. Long-acting). It is of outmost importance that patients keep tract of their pain, after the procedure. To assist with this matter, a "Post-procedure Pain Diary" is provided. Make sure to complete it and to bring it back to your follow-up appointment.  As long as the patient keeps accurate, detailed records of their symptoms after every procedure, and returns to have those interpreted, every procedure will provide us with invaluable information. Even a block that does not provide the patient with any relief, will always provide us with information about the mechanism and the origin of the pain. The only time a nerve block can be considered a waste of time is when patients do not keep track of the results, or do not keep their post-procedure appointment.  Reporting the results back to your physician The Pain Score  Pain is a subjective complaint. It cannot be seen, touched, or measured. We depend entirely on the patient's report of the pain in order to assess your condition and treatment. To evaluate the pain, we use a pain scale, where "0" means "No Pain", and a "10" is "the worst possible pain that you can even imagine" (i.e. something like been eaten alive by a shark or being torn apart by a lion).   Use the Pain Scale provided. You  will frequently be asked to rate your pain. Please be accurate, remember that medical decisions will be based on your responses. Please do not rate your pain above a 10. Doing so is actually interpreted as "symptom magnification" (exaggeration). To put this into perspective, when you tell us that your pain is at a 10 (ten), what you are saying is that there is nothing we can do to make this pain any worse. (Carefully think about that.) ____________________________________________________________________________________________

## 2018-09-28 NOTE — Progress Notes (Signed)
Patient's Name: Anne Chandler  MRN: [id number]  Referring Provider: Binnie Kand, MD  DOB: November 26, 1980  PCP: Binnie Kand, MD  DOS: 09/28/2018  Note by: Edward Jolly, MD  Service setting: Ambulatory outpatient  Specialty: Interventional Pain Management  Patient type: Established  Location: [facility] (AMB) Pain Management Facility  Visit type: Interventional Procedure   Primary Reason for Visit: Interventional Pain Management Treatment. CC: Back Pain (lower)  Procedure:          Anesthesia, Analgesia, Anxiolysis:  Type: Diagnostic Inter-Laminar Epidural Steroid Injection  #2  Region: Lumbar Level: L4-5 Level. Laterality: Left-Sided         Type: Local Anesthesia  Local Anesthetic: Lidocaine 1-2%  Position: Prone with head of the table was raised to facilitate breathing.   Indications: 1. Lumbar radiculopathy   2. Lumbar radicular syndrome   3. Acute left-sided low back pain with left-sided sciatica   4. Pharmacologic therapy    Pain Score: Pre-procedure: 7 /10 Post-procedure: 6 /10   Pre-op Assessment:  Anne Chandler is a 38 y.o. (year old), female patient, seen today for interventional treatment. She  has a past surgical history that includes Tubal ligation. Anne Chandler has a current medication list which includes the following prescription(s): gabapentin, oxycodone-acetaminophen, and prednisone, and the following Facility-Administered Medications: fentanyl. Her primarily concern today is the Back Pain (lower)  Initial Vital Signs:  Pulse/HCG Rate: 86ECG Heart Rate: 87 Temp: 98.3 F (36.8 C) Resp: 16 BP: 139/80 SpO2: 97 %  BMI: Estimated body mass index is 26.63 kg/m as calculated from the following:   Height as of this encounter: 5\' 6"  (1.676 m).   Weight as of this encounter: 165 lb (74.8 kg).  Risk Assessment: Allergies: Reviewed. She has No Known Allergies.  Allergy Precautions: None required Coagulopathies: Reviewed. None identified.  Blood-thinner therapy: None  at this time Active Infection(s): Reviewed. None identified. Anne Chandler is afebrile  Site Confirmation: Anne Chandler was asked to confirm the procedure and laterality before marking the site Procedure checklist: Completed Consent: Before the procedure and under the influence of no sedative(s), amnesic(s), or anxiolytics, the patient was informed of the treatment options, risks and possible complications. To fulfill our ethical and legal obligations, as recommended by the American Medical Association's Code of Ethics, I have informed the patient of my clinical impression; the nature and purpose of the treatment or procedure; the risks, benefits, and possible complications of the intervention; the alternatives, including doing nothing; the risk(s) and benefit(s) of the alternative treatment(s) or procedure(s); and the risk(s) and benefit(s) of doing nothing. The patient was provided information about the general risks and possible complications associated with the procedure. These may include, but are not limited to: failure to achieve desired goals, infection, bleeding, organ or nerve damage, allergic reactions, paralysis, and death. In addition, the patient was informed of those risks and complications associated to Spine-related procedures, such as failure to decrease pain; infection (i.e.: Meningitis, epidural or intraspinal abscess); bleeding (i.e.: epidural hematoma, subarachnoid hemorrhage, or any other type of intraspinal or peri-dural bleeding); organ or nerve damage (i.e.: Any type of peripheral nerve, nerve root, or spinal cord injury) with subsequent damage to sensory, motor, and/or autonomic systems, resulting in permanent pain, numbness, and/or weakness of one or several areas of the body; allergic reactions; (i.e.: anaphylactic reaction); and/or death. Furthermore, the patient was informed of those risks and complications associated with the medications. These include, but are not limited to:  allergic reactions (i.e.: anaphylactic or anaphylactoid reaction(s)); adrenal axis  suppression; blood sugar elevation that in diabetics may result in ketoacidosis or comma; water retention that in patients with history of congestive heart failure may result in shortness of breath, pulmonary edema, and decompensation with resultant heart failure; weight gain; swelling or edema; medication-induced neural toxicity; particulate matter embolism and blood vessel occlusion with resultant organ, and/or nervous system infarction; and/or aseptic necrosis of one or more joints. Finally, the patient was informed that Medicine is not an exact science; therefore, there is also the possibility of unforeseen or unpredictable risks and/or possible complications that may result in a catastrophic outcome. The patient indicated having understood very clearly. We have given the patient no guarantees and we have made no promises. Enough time was given to the patient to ask questions, all of which were answered to the patient's satisfaction. Anne Chandler has indicated that she wanted to continue with the procedure. Attestation: I, the ordering provider, attest that I have discussed with the patient the benefits, risks, side-effects, alternatives, likelihood of achieving goals, and potential problems during recovery for the procedure that I have provided informed consent. Date  Time: 09/28/2018  1:30 PM  Pre-Procedure Preparation:  Monitoring: As per clinic protocol. Respiration, ETCO2, SpO2, BP, heart rate and rhythm monitor placed and checked for adequate function Safety Precautions: Patient was assessed for positional comfort and pressure points before starting the procedure. Time-out: I initiated and conducted the "Time-out" before starting the procedure, as per protocol. The patient was asked to participate by confirming the accuracy of the "Time Out" information. Verification of the correct person, site, and procedure were  performed and confirmed by me, the nursing staff, and the patient. "Time-out" conducted as per Joint Commission's Universal Protocol (UP.01.01.01). Time: 1435  Description of Procedure:          Target Area: The interlaminar space, initially targeting the lower laminar border of the superior vertebral body. Approach: Paramedial approach. Area Prepped: Entire Posterior Lumbar Region Prepping solution: DuraPrep (Iodine Povacrylex [0.7% available iodine] and Isopropyl Alcohol, 74% w/w) Safety Precautions: Aspiration looking for blood return was conducted prior to all injections. At no point did we inject any substances, as a needle was being advanced. No attempts were made at seeking any paresthesias. Safe injection practices and needle disposal techniques used. Medications properly checked for expiration dates. SDV (single dose vial) medications used. Description of the Procedure: Protocol guidelines were followed. The procedure needle was introduced through the skin, ipsilateral to the reported pain, and advanced to the target area. Bone was contacted and the needle walked caudad, until the lamina was cleared. The epidural space was identified using "loss-of-resistance technique" with 2-3 ml of PF-NaCl (0.9% NSS), in a 5cc LOR glass syringe.  Vitals:   09/28/18 1444 09/28/18 1451 09/28/18 1500 09/28/18 1511  BP: (!) 137/8 128/88 125/83 (!) 219/88  Pulse:      Resp: 19 20 20 20   Temp:      SpO2: 98% 97% 97% 98%  Weight:      Height:        Start Time: 1436 hrs. End Time: 1443 hrs.  Materials:  Needle(s) Type: Epidural needle Gauge: 17G Length: 3.5-in Medication(s): Please see orders for medications and dosing details. 8 cc solution made of 5 cc of preservative-free saline, 2 cc of 0.2% ropivacaine, 1 cc of Decadron 10 mg/cc.  Imaging Guidance (Spinal):          Type of Imaging Technique: Fluoroscopy Guidance (Spinal) Indication(s): Assistance in needle guidance and placement for  procedures  requiring needle placement in or near specific anatomical locations not easily accessible without such assistance. Exposure Time: Please see nurses notes. Contrast: Before injecting any contrast, we confirmed that the patient did not have an allergy to iodine, shellfish, or radiological contrast. Once satisfactory needle placement was completed at the desired level, radiological contrast was injected. Contrast injected under live fluoroscopy. No contrast complications. See chart for type and volume of contrast used. Fluoroscopic Guidance: I was personally present during the use of fluoroscopy. "Tunnel Vision Technique" used to obtain the best possible view of the target area. Parallax error corrected before commencing the procedure. "Direction-depth-direction" technique used to introduce the needle under continuous pulsed fluoroscopy. Once target was reached, antero-posterior, oblique, and lateral fluoroscopic projection used confirm needle placement in all planes. Images permanently stored in EMR. Interpretation: I personally interpreted the imaging intraoperatively. Adequate needle placement confirmed in multiple planes. Appropriate spread of contrast into desired area was observed. No evidence of afferent or efferent intravascular uptake. No intrathecal or subarachnoid spread observed. Permanent images saved into the patient's record.  Antibiotic Prophylaxis:   Anti-infectives (From admission, onward)   None     Indication(s): None identified  Post-operative Assessment:  Post-procedure Vital Signs:  Pulse/HCG Rate: 8683 Temp: 98.3 F (36.8 C) Resp: 20 BP: (!) 219/88 SpO2: 98 %  EBL: None  Complications: No immediate post-treatment complications observed by team, or reported by patient.  Note: The patient tolerated the entire procedure well. A repeat set of vitals were taken after the procedure and the patient was kept under observation following institutional policy, for this  type of procedure. Post-procedural neurological assessment was performed, showing return to baseline, prior to discharge. The patient was provided with post-procedure discharge instructions, including a section on how to identify potential problems. Should any problems arise concerning this procedure, the patient was given instructions to immediately contact us, at any time, without hesitation. In any case, we plan to contact the patient by telephone for a follow-up status report regarding this interventional procedure.  Comments:  No additional relevant information.   Plan of Care   Lumbar MRI 08/04/2018: Impression: 1.  At L4-5 there is a broad-based disc bulge with left paracentral disc extrusion with cephalad migration of disc material and mass-effect upon the left intraspinal L4-L5 nerve roots.  Mild bilateral facet arthropathy.  Left lateral recess stenosis.  Moderate left foraminal stenosis.  Moderate foraminal stenosis.  Mild spinal stenosis. 2.  At L5-S1 there is a broad-based disc bulge with a small central disc protrusion.  Bilateral lateral recess narrowing and moderate right foraminal stenosis. 3.  At L3-L4 there is mild broad-based disc bulge with mild bilateral facet arthropathy and bilateral recess narrowing.   Of note, patient's first ESI on 09/05/2018 which was providing her benefit in regards to her left lower extremity radicular pain. Approx 1 week ago, patient describes severe back and leg pain to the point that she could not walk. She went to urgent care and there they prescribed her steroid taper, muscle relaxant, and short course of opioid therapy. It is unclear why the patient suddenly had an increase in her pain without an inciting event. She finds it difficult to walk and is endorsing severe pain in her left lateral thigh, anterior thigh that radiates down to her left calf.  Plan for repeat ESI #2. Increase Gabapentin to 300 mg TID. Rx for Prednisone which patient can begin in  7 days if her sx are not improved after ESI.  At this  point, given her worsening radicular pain with MRI evidence of L4/5 disc herniation and mass effect, recommend referral to Neurosurgery for possible surgical intervention.  Orders:  Orders Placed This Encounter  Procedures  . Crutches  . DG PAIN CLINIC C-ARM 1-60 MIN NO REPORT    Intraoperative interpretation by procedural physician at [facility].    Standing Status:   Standing    Number of Occurrences:   1    Order Specific Question:   Reason for exam:    Answer:   Assistance in needle guidance and placement for procedures requiring needle placement in or near specific anatomical locations not easily accessible without such assistance.  . Compliance Drug Analysis, Ur    Volume: 30 ml(s). Minimum 3 ml of urine is needed. Document temperature of fresh sample. Indications: Long term (current) use of opiate analgesic (Z79.891) Test#: [id number] (Comprehensive Profile)  . Ambulatory referral to Neurosurgery    Referral Priority:   Routine    Referral Type:   Surgical    Referral Reason:   Specialty Services Required    Referred to Provider:   Venetia NightYarbrough, Chester, MD    Requested Specialty:   Neurosurgery    Number of Visits Requested:   1  . Ambulatory referral to Psychology    Referral Priority:   Routine    Referral Type:   Psychiatric    Referral Reason:   Specialty Services Required    Referred to Provider:   Jomarie LongsEappen, Saramma, MD    Requested Specialty:   Psychiatry    Number of Visits Requested:   1    Medications ordered for procedure: Meds ordered this encounter  Medications  . predniSONE (DELTASONE) 20 MG tablet    Sig: Take 3 tablets (60 mg total) by mouth daily with breakfast for 3 days, THEN 2 tablets (40 mg total) daily with breakfast for 3 days, THEN 1 tablet (20 mg total) daily with breakfast for 3 days.    Dispense:  18 tablet    Refill:  0  . gabapentin (NEURONTIN) 300 MG capsule    Sig: 300 mg TID     Dispense:  90 capsule    Refill:  2  . iohexol (OMNIPAQUE) 180 MG/ML injection 10 mL    Must be Myelogram-compatible. If not available, you may substitute with a water-soluble, non-ionic, hypoallergenic, myelogram-compatible radiological contrast medium.  . fentaNYL (SUBLIMAZE) injection 25-50 mcg    Make sure Narcan is available in the pyxis when using this medication. In the event of respiratory depression (RR< 8/min): Titrate NARCAN (naloxone) in increments of 0.1 to 0.2 mg IV at 2-3 minute intervals, until desired degree of reversal.  . ropivacaine (PF) 2 mg/mL (0.2%) (NAROPIN) injection 1 mL  . dexamethasone (DECADRON) injection 10 mg  . sodium chloride flush (NS) 0.9 % injection 1 mL   Medications administered: We administered iohexol, fentaNYL, ropivacaine (PF) 2 mg/mL (0.2%), dexamethasone, and sodium chloride flush.  See the medical record for exact dosing, route, and time of administration.  Follow-up plan:   Return in about 4 weeks (around 10/26/2018) for Post Procedure Evaluation, virtual.      Status post left L4-L5 ESI 09/05/2018- helped for 3.5 weeks with sudden and severe return of pain without inciting event. Repeat L4-L5 ESI #2 on 09/28/2018.   Recent Visits Date Type Provider Dept  09/05/18 Procedure visit Edward JollyLateef, Markanthony Gedney, MD [facility]  08/31/18 Office Visit Edward JollyLateef, Jaasiel [place], MD [facility]  Showing recent visits within past 7890  days and meeting all other requirements   Today's Visits Date Type Provider Dept  09/28/18 Procedure visit Edward JollyLateef, Galvin Aversa, MD [facility]  Showing today's visits and meeting all other requirements   Future Appointments Date Type Provider Dept  11/03/18 Appointment Edward JollyLateef, Marion Seese, MD [facility]  Showing future appointments within next 90 days and meeting all other requirements   Disposition: Discharge home  Discharge Date & Time: 09/28/2018; 1515 hrs.   Primary Care Physician: Binnie KandAnderson, Jessica,  MD [facility] Note by: Edward JollyBilal Asa Fath, MD Date: 09/28/2018; Time: 4:41 PM  Disclaimer:  Medicine is not an exact science. The only guarantee in medicine is that nothing is guaranteed. It is important to note that the decision to proceed with this intervention was based on the information collected from the patient. The Data and conclusions were drawn from the patient's questionnaire, the interview, and the physical examination. Because the information was provided in large part by the patient, it cannot be guaranteed that it has not been purposely or unconsciously manipulated. Every effort has been made to obtain as much relevant data as possible for this evaluation. It is important to note that the conclusions that lead to this procedure are derived in large part from the available data. Always take into account that the treatment will also be dependent on availability of resources and existing treatment guidelines, considered by other Pain Management Practitioners as being common knowledge and practice, at the time of the intervention. For Medico-Legal purposes, it is also important to point out that variation in procedural techniques and pharmacological choices are the acceptable norm. The indications, contraindications, technique, and results of the above procedure should only be interpreted and judged by a Board-Certified Interventional Pain Specialist with extensive familiarity and expertise in the same exact procedure and technique.

## 2018-10-04 ENCOUNTER — Other Ambulatory Visit: Payer: Self-pay | Admitting: Neurosurgery

## 2018-10-04 ENCOUNTER — Ambulatory Visit: Payer: Medicaid Other | Admitting: Student in an Organized Health Care Education/Training Program

## 2018-10-19 ENCOUNTER — Inpatient Hospital Stay: Admission: RE | Admit: 2018-10-19 | Payer: Medicaid Other | Source: Ambulatory Visit

## 2018-10-20 ENCOUNTER — Telehealth: Payer: Self-pay | Admitting: Student in an Organized Health Care Education/Training Program

## 2018-10-20 NOTE — Telephone Encounter (Signed)
Patient advised she should not receive steroids close to surgery date.

## 2018-10-20 NOTE — Telephone Encounter (Signed)
Patient has surgery scheduled on Wed. She wanted to know if she could get another round of steroids. Please call patient and let her know if this is possible and if it is a good idea to have steroids this close to surgery.

## 2018-10-21 ENCOUNTER — Other Ambulatory Visit: Payer: Self-pay

## 2018-10-21 ENCOUNTER — Other Ambulatory Visit: Admission: RE | Admit: 2018-10-21 | Payer: Medicaid Other | Source: Ambulatory Visit

## 2018-10-21 ENCOUNTER — Encounter
Admission: RE | Admit: 2018-10-21 | Discharge: 2018-10-21 | Disposition: A | Discharge status: Home / Self Care | Payer: Medicaid Other | Source: Ambulatory Visit | Attending: Neurosurgery | Admitting: Neurosurgery

## 2018-10-21 DIAGNOSIS — Z01812 Encounter for preprocedural laboratory examination: Secondary | ICD-10-CM | POA: Diagnosis not present

## 2018-10-21 DIAGNOSIS — M5416 Radiculopathy, lumbar region: Secondary | ICD-10-CM | POA: Insufficient documentation

## 2018-10-21 DIAGNOSIS — Z20828 Contact with and (suspected) exposure to other viral communicable diseases: Secondary | ICD-10-CM | POA: Insufficient documentation

## 2018-10-21 LAB — BASIC METABOLIC PANEL
Anion gap: 9 (ref 5–15)
BUN: 11 mg/dL (ref 6–20)
CO2: 27 mmol/L (ref 22–32)
Calcium: 9 mg/dL (ref 8.9–10.3)
Chloride: 104 mmol/L (ref 98–111)
Creatinine, Ser: 0.68 mg/dL (ref 0.44–1.00)
GFR calc Af Amer: >60 mL/min (ref >60)
GFR calc non Af Amer: >60 mL/min (ref >60)
Glucose, Bld: 89 mg/dL (ref 70–99)
Potassium: 3.2 mmol/L — ABNORMAL LOW (ref 3.5–5.1)
Sodium: 140 mmol/L (ref 135–145)

## 2018-10-21 LAB — URINALYSIS, ROUTINE W REFLEX MICROSCOPIC
Bilirubin Urine: NEGATIVE
Glucose, UA: NEGATIVE mg/dL
Ketones, ur: NEGATIVE mg/dL
Nitrite: POSITIVE — AB
Protein, ur: NEGATIVE mg/dL
Specific Gravity, Urine: 1.01 (ref 1.005–1.030)
WBC, UA: >50 WBC/hpf — ABNORMAL HIGH (ref 0–5)
pH: 5 (ref 5.0–8.0)

## 2018-10-21 LAB — CBC
HCT: 38 % (ref 36.0–46.0)
Hemoglobin: 12.2 g/dL (ref 12.0–15.0)
MCH: 30.3 pg (ref 26.0–34.0)
MCHC: 32.1 g/dL (ref 30.0–36.0)
MCV: 94.3 fL (ref 80.0–100.0)
Platelets: 260 10*3/uL (ref 150–400)
RBC: 4.03 MIL/uL (ref 3.87–5.11)
RDW: 14.5 % (ref 11.5–15.5)
WBC: 10.2 10*3/uL (ref 4.0–10.5)
nRBC: 0 % (ref 0.0–0.2)

## 2018-10-21 LAB — TYPE AND SCREEN
ABO/RH(D): O POS
Antibody Screen: NEGATIVE

## 2018-10-21 LAB — PROTIME-INR
INR: 1 (ref 0.8–1.2)
Prothrombin Time: 12.7 seconds (ref 11.4–15.2)

## 2018-10-21 LAB — APTT: aPTT: 29 seconds (ref 24–36)

## 2018-10-21 NOTE — Patient Instructions (Signed)
Your procedure is scheduled on: Wednesday 10/26/18 Report to [place]. To find out your arrival time please call [phone] between 1PM - 3PM on Tuesday 10/25/18.  Remember: Instructions that are not followed completely may result in serious medical risk, up to and including death, or upon the discretion of your surgeon and anesthesiologist your surgery may need to be rescheduled.     _X__ 1. Do not eat food after midnight the night before your procedure.                 No gum chewing or hard candies. You may drink clear liquids up to 2 hours                 before you are scheduled to arrive for your surgery- DO not drink clear                 liquids within 2 hours of the start of your surgery.                 Clear Liquids include:  water, apple juice without pulp, clear carbohydrate                 drink such as Clearfast or Gatorade, Black Coffee or Tea (Do not add                 anything to coffee or tea). Diabetics water only  __X__2.  On the morning of surgery brush your teeth with toothpaste and water, you                 may rinse your mouth with mouthwash if you wish.  Do not swallow any              toothpaste of mouthwash.     _X__ 3.  No Alcohol for 24 hours before or after surgery.   _X__ 4.  Do Not Smoke or use e-cigarettes For 24 Hours Prior to Your Surgery.                 Do not use any chewable tobacco products for at least 6 hours prior to                 surgery.  ____  5.  Bring all medications with you on the day of surgery if instructed.   __X__  6.  Notify your doctor if there is any change in your medical condition      (cold, fever, infections).     Do not wear jewelry, make-up, hairpins, clips or nail polish. Do not wear lotions, powders, or perfumes.  Do not shave 48 hours prior to surgery. Men may shave face and neck. Do not bring valuables to the hospital.    [facility] is not responsible for  any belongings or valuables.  Contacts, dentures/partials or body piercings may not be worn into surgery. Bring a case for your contacts, glasses or hearing aids, a denture cup will be supplied. Leave your suitcase in the car. After surgery it may be brought to your room. For patients admitted to the hospital, discharge time is determined by your treatment team.   Patients discharged the day of surgery will not be allowed to drive home.   Please read over the following fact sheets that you were given:   MRSA Information  __X__ Take these medicines the morning of surgery with A SIP OF WATER:  1. gabapentin (NEURONTIN  2. oxyCODONE (OXY IR/ROXICODONE if needed  3.   4.  5.  6.  ____ Fleet Enema (as directed)   __X__ Use CHG Soap/SAGE wipes as directed  ____ Use inhalers on the day of surgery  ____ Stop metformin/Janumet/Farxiga 2 days prior to surgery    ____ Take 1/2 of usual insulin dose the night before surgery. No insulin the morning          of surgery.   ____ Stop Blood Thinners Coumadin/Plavix/Xarelto/Pleta/Pradaxa/Eliquis/Effient/Aspirin  on   Or contact your Surgeon, Cardiologist or Medical Doctor regarding  ability to stop your blood thinners  __X__ Stop Anti-inflammatories 7 days before surgery such as Advil, Ibuprofen, Motrin,  BC or Goodies Powder, Naprosyn, Naproxen, Aleve, Aspirin    __X__ Stop all herbal supplements, fish oil or vitamin E until after surgery.    ____ Bring C-Pap to the hospital.

## 2018-10-22 LAB — SARS CORONAVIRUS 2 (TAT 6-24 HRS): SARS Coronavirus 2: NEGATIVE

## 2018-10-26 ENCOUNTER — Encounter: Payer: Self-pay | Admitting: *Deleted

## 2018-10-26 ENCOUNTER — Ambulatory Visit: Payer: Medicaid Other | Admitting: Anesthesiology

## 2018-10-26 ENCOUNTER — Ambulatory Visit
Admission: RE | Admit: 2018-10-26 | Discharge: 2018-10-26 | Disposition: A | Discharge status: Home / Self Care | Payer: Medicaid Other | Attending: Neurosurgery | Admitting: Neurosurgery

## 2018-10-26 ENCOUNTER — Ambulatory Visit: Payer: Medicaid Other

## 2018-10-26 ENCOUNTER — Encounter
Admission: RE | Disposition: A | Discharge status: Home / Self Care | Payer: Self-pay | Source: Home / Self Care | Attending: Neurosurgery

## 2018-10-26 DIAGNOSIS — M5116 Intervertebral disc disorders with radiculopathy, lumbar region: Secondary | ICD-10-CM | POA: Diagnosis not present

## 2018-10-26 DIAGNOSIS — M5416 Radiculopathy, lumbar region: Secondary | ICD-10-CM | POA: Diagnosis present

## 2018-10-26 DIAGNOSIS — Z79899 Other long term (current) drug therapy: Secondary | ICD-10-CM | POA: Insufficient documentation

## 2018-10-26 DIAGNOSIS — Z419 Encounter for procedure for purposes other than remedying health state, unspecified: Secondary | ICD-10-CM

## 2018-10-26 HISTORY — PX: LUMBAR LAMINECTOMY/DECOMPRESSION MICRODISCECTOMY: SHX5026

## 2018-10-26 LAB — ABO/RH: ABO/RH(D): O POS

## 2018-10-26 LAB — POCT PREGNANCY, URINE: Preg Test, Ur: NEGATIVE

## 2018-10-26 SURGERY — LUMBAR LAMINECTOMY/DECOMPRESSION MICRODISCECTOMY 1 LEVEL
Anesthesia: General | Laterality: Left

## 2018-10-26 MED ORDER — FENTANYL CITRATE (PF) 100 MCG/2ML IJ SOLN
25.0000 ug | INTRAMUSCULAR | Status: DC | PRN
  Administered 2018-10-26 (×4): 25 ug via INTRAVENOUS

## 2018-10-26 MED ORDER — BUPIVACAINE-EPINEPHRINE (PF) 0.5% -1:200000 IJ SOLN
INTRAMUSCULAR | Status: AC
  Filled 2018-10-26: qty 30

## 2018-10-26 MED ORDER — MIDAZOLAM HCL 2 MG/2ML IJ SOLN
INTRAMUSCULAR | Status: AC
  Filled 2018-10-26: qty 2

## 2018-10-26 MED ORDER — LACTATED RINGERS IV SOLN
1000.0000 mL | Freq: Once | INTRAVENOUS | Status: AC
  Administered 2018-10-26: 14:00:00 via INTRAVENOUS

## 2018-10-26 MED ORDER — FENTANYL CITRATE (PF) 250 MCG/5ML IJ SOLN
INTRAMUSCULAR | Status: AC
  Filled 2018-10-26: qty 5

## 2018-10-26 MED ORDER — THROMBIN 5000 UNITS EX SOLR
CUTANEOUS | Status: AC
  Filled 2018-10-26: qty 5000

## 2018-10-26 MED ORDER — LACTATED RINGERS IV SOLN
INTRAVENOUS | Status: DC
  Administered 2018-10-26: 13:00:00 via INTRAVENOUS

## 2018-10-26 MED ORDER — SUCCINYLCHOLINE CHLORIDE 20 MG/ML IJ SOLN
INTRAMUSCULAR | Status: DC | PRN
  Administered 2018-10-26: 140 mg via INTRAVENOUS

## 2018-10-26 MED ORDER — METHYLPREDNISOLONE 4 MG PO TBPK: ORAL_TABLET | ORAL | 0 refills | Status: AC

## 2018-10-26 MED ORDER — CEFAZOLIN SODIUM-DEXTROSE 1-4 GM/50ML-% IV SOLN
INTRAVENOUS | Status: DC | PRN
  Administered 2018-10-26: 2 g via INTRAVENOUS

## 2018-10-26 MED ORDER — BACLOFEN 10 MG PO TABS: 10.0000 mg | ORAL_TABLET | Freq: Three times a day (TID) | ORAL | 0 refills | Status: AC

## 2018-10-26 MED ORDER — OXYCODONE HCL 5 MG PO TABS
ORAL_TABLET | ORAL | Status: AC
  Administered 2018-10-26: 5 mg via ORAL
  Filled 2018-10-26: qty 1

## 2018-10-26 MED ORDER — OXYCODONE HCL 5 MG PO TABS
5.0000 mg | ORAL_TABLET | ORAL | Status: DC | PRN
  Administered 2018-10-26: 18:00:00 5 mg via ORAL
  Filled 2018-10-26: qty 1

## 2018-10-26 MED ORDER — SODIUM CHLORIDE 0.9 % IR SOLN
Status: DC | PRN
  Administered 2018-10-26: 1000 mL

## 2018-10-26 MED ORDER — PHENYLEPHRINE HCL (PRESSORS) 10 MG/ML IV SOLN
INTRAVENOUS | Status: DC | PRN
  Administered 2018-10-26 (×3): 100 ug via INTRAVENOUS

## 2018-10-26 MED ORDER — DEXMEDETOMIDINE HCL 200 MCG/2ML IV SOLN
INTRAVENOUS | Status: DC | PRN
  Administered 2018-10-26 (×3): 4 ug via INTRAVENOUS

## 2018-10-26 MED ORDER — SODIUM CHLORIDE 0.9 % IV SOLN
INTRAVENOUS | Status: DC | PRN
  Administered 2018-10-26: 40 mL

## 2018-10-26 MED ORDER — SODIUM CHLORIDE FLUSH 0.9 % IV SOLN
INTRAVENOUS | Status: AC
  Filled 2018-10-26: qty 10

## 2018-10-26 MED ORDER — THROMBIN 5000 UNITS EX SOLR
CUTANEOUS | Status: DC | PRN
  Administered 2018-10-26: 5000 [IU] via TOPICAL

## 2018-10-26 MED ORDER — BUPIVACAINE HCL 0.5 % IJ SOLN
INTRAMUSCULAR | Status: DC | PRN
  Administered 2018-10-26: 20 mL

## 2018-10-26 MED ORDER — FENTANYL CITRATE (PF) 100 MCG/2ML IJ SOLN
INTRAMUSCULAR | Status: AC
  Administered 2018-10-26: 25 ug via INTRAVENOUS
  Filled 2018-10-26: qty 2

## 2018-10-26 MED ORDER — DEXAMETHASONE SODIUM PHOSPHATE 10 MG/ML IJ SOLN
INTRAMUSCULAR | Status: DC | PRN
  Administered 2018-10-26: 4 mg via INTRAVENOUS

## 2018-10-26 MED ORDER — LIDOCAINE HCL (CARDIAC) PF 100 MG/5ML IV SOSY
PREFILLED_SYRINGE | INTRAVENOUS | Status: DC | PRN
  Administered 2018-10-26: 60 mg via INTRAVENOUS

## 2018-10-26 MED ORDER — CEFAZOLIN SODIUM-DEXTROSE 2-4 GM/100ML-% IV SOLN
INTRAVENOUS | Status: AC
  Filled 2018-10-26: qty 100

## 2018-10-26 MED ORDER — ONDANSETRON HCL 4 MG/2ML IJ SOLN
INTRAMUSCULAR | Status: DC | PRN
  Administered 2018-10-26: 4 mg via INTRAVENOUS

## 2018-10-26 MED ORDER — GABAPENTIN 300 MG PO CAPS: ORAL_CAPSULE | ORAL | 2 refills | Status: AC

## 2018-10-26 MED ORDER — BUPIVACAINE-EPINEPHRINE (PF) 0.5% -1:200000 IJ SOLN
INTRAMUSCULAR | Status: DC | PRN
  Administered 2018-10-26: 5 mL

## 2018-10-26 MED ORDER — ONDANSETRON HCL 4 MG/2ML IJ SOLN: 4.0000 mg | Freq: Once | INTRAMUSCULAR | Status: DC | PRN

## 2018-10-26 MED ORDER — BUPIVACAINE HCL (PF) 0.5 % IJ SOLN
INTRAMUSCULAR | Status: AC
  Filled 2018-10-26: qty 30

## 2018-10-26 MED ORDER — FAMOTIDINE 20 MG PO TABS
20.0000 mg | ORAL_TABLET | Freq: Once | ORAL | Status: AC
  Administered 2018-10-26: 13:00:00 20 mg via ORAL

## 2018-10-26 MED ORDER — SEVOFLURANE IN SOLN
RESPIRATORY_TRACT | Status: AC
  Filled 2018-10-26: qty 250

## 2018-10-26 MED ORDER — METHYLPREDNISOLONE ACETATE 40 MG/ML IJ SUSP
INTRAMUSCULAR | Status: AC
  Filled 2018-10-26: qty 1

## 2018-10-26 MED ORDER — PROPOFOL 10 MG/ML IV BOLUS
INTRAVENOUS | Status: DC | PRN
  Administered 2018-10-26: 150 mg via INTRAVENOUS

## 2018-10-26 MED ORDER — ROCURONIUM BROMIDE 100 MG/10ML IV SOLN
INTRAVENOUS | Status: DC | PRN
  Administered 2018-10-26: 5 mg via INTRAVENOUS
  Administered 2018-10-26: 15 mg via INTRAVENOUS

## 2018-10-26 MED ORDER — OXYCODONE HCL 5 MG PO TABS: 5.0000 mg | ORAL_TABLET | ORAL | 0 refills | Status: AC | PRN

## 2018-10-26 MED ORDER — PROPOFOL 10 MG/ML IV BOLUS
INTRAVENOUS | Status: AC
  Filled 2018-10-26: qty 20

## 2018-10-26 MED ORDER — BACITRACIN 50000 UNITS IM SOLR
INTRAMUSCULAR | Status: AC
  Filled 2018-10-26: qty 1

## 2018-10-26 MED ORDER — FAMOTIDINE 20 MG PO TABS
ORAL_TABLET | ORAL | Status: AC
  Administered 2018-10-26: 20 mg via ORAL
  Filled 2018-10-26: qty 1

## 2018-10-26 MED ORDER — MIDAZOLAM HCL 2 MG/2ML IJ SOLN
INTRAMUSCULAR | Status: DC | PRN
  Administered 2018-10-26: 2 mg via INTRAVENOUS

## 2018-10-26 MED ORDER — BUPIVACAINE LIPOSOME 1.3 % IJ SUSP
INTRAMUSCULAR | Status: AC
  Filled 2018-10-26: qty 20

## 2018-10-26 MED ORDER — FENTANYL CITRATE (PF) 100 MCG/2ML IJ SOLN
INTRAMUSCULAR | Status: DC | PRN
  Administered 2018-10-26: 50 ug via INTRAVENOUS
  Administered 2018-10-26: 100 ug via INTRAVENOUS
  Administered 2018-10-26 (×2): 50 ug via INTRAVENOUS

## 2018-10-26 MED ORDER — FENTANYL CITRATE (PF) 100 MCG/2ML IJ SOLN
INTRAMUSCULAR | Status: AC
  Filled 2018-10-26: qty 2

## 2018-10-26 SURGICAL SUPPLY — 59 items
BUR NEURO DRILL SOFT 3.0X3.8M (BURR) ×3 IMPLANT
CANISTER SUCT 1200ML W/VALVE (MISCELLANEOUS) ×6 IMPLANT
CHLORAPREP W/TINT 26 (MISCELLANEOUS) ×6 IMPLANT
CNTNR SPEC 2.5X3XGRAD LEK (MISCELLANEOUS) ×1
CONT SPEC 4OZ STER OR WHT (MISCELLANEOUS) ×2
CONTAINER SPEC 2.5X3XGRAD LEK (MISCELLANEOUS) ×1 IMPLANT
COUNTER NEEDLE 20/40 LG (NEEDLE) ×3 IMPLANT
COVER LIGHT HANDLE STERIS (MISCELLANEOUS) ×6 IMPLANT
COVER WAND RF STERILE (DRAPES) ×3 IMPLANT
CUP MEDICINE 2OZ PLAST GRAD ST (MISCELLANEOUS) ×6 IMPLANT
DERMABOND ADVANCED (GAUZE/BANDAGES/DRESSINGS) ×2
DERMABOND ADVANCED .7 DNX12 (GAUZE/BANDAGES/DRESSINGS) ×1 IMPLANT
DRAPE C-ARM 42X72 X-RAY (DRAPES) ×6 IMPLANT
DRAPE LAPAROTOMY 100X77 ABD (DRAPES) ×3 IMPLANT
DRAPE MICROSCOPE SPINE 48X150 (DRAPES) ×3 IMPLANT
DRAPE POUCH INSTRU U-SHP 10X18 (DRAPES) ×3 IMPLANT
DRAPE SURG 17X11 SM STRL (DRAPES) ×12 IMPLANT
ELECT CAUTERY BLADE TIP 2.5 (TIP) ×3
ELECT EZSTD 165MM 6.5IN (MISCELLANEOUS)
ELECT REM PT RETURN 9FT ADLT (ELECTROSURGICAL) ×3
ELECTRODE CAUTERY BLDE TIP 2.5 (TIP) ×1 IMPLANT
ELECTRODE EZSTD 165MM 6.5IN (MISCELLANEOUS) IMPLANT
ELECTRODE REM PT RTRN 9FT ADLT (ELECTROSURGICAL) ×1 IMPLANT
FRAME EYE SHIELD (PROTECTIVE WEAR) ×6 IMPLANT
GLOVE BIOGEL PI IND STRL 7.0 (GLOVE) ×1 IMPLANT
GLOVE BIOGEL PI INDICATOR 7.0 (GLOVE) ×2
GLOVE SURG SYN 7.0 (GLOVE) ×6 IMPLANT
GLOVE SURG SYN 7.0 PF PI (GLOVE) ×2 IMPLANT
GLOVE SURG SYN 8.5  E (GLOVE) ×6
GLOVE SURG SYN 8.5 E (GLOVE) ×3 IMPLANT
GLOVE SURG SYN 8.5 PF PI (GLOVE) ×3 IMPLANT
GOWN SRG XL LVL 3 NONREINFORCE (GOWNS) ×1 IMPLANT
GOWN STRL NON-REIN TWL XL LVL3 (GOWNS) ×2
GOWN STRL REUS W/TWL MED LVL3 (GOWN DISPOSABLE) ×3 IMPLANT
GRADUATE 1200CC STRL 31836 (MISCELLANEOUS) ×3 IMPLANT
KIT SPINAL PRONEVIEW (KITS) ×3 IMPLANT
KNIFE BAYONET SHORT DISCETOMY (MISCELLANEOUS) IMPLANT
MARKER SKIN DUAL TIP RULER LAB (MISCELLANEOUS) ×3 IMPLANT
NDL SAFETY ECLIPSE 18X1.5 (NEEDLE) ×1 IMPLANT
NEEDLE HYPO 18GX1.5 SHARP (NEEDLE) ×2
NEEDLE HYPO 22GX1.5 SAFETY (NEEDLE) ×3 IMPLANT
NS IRRIG 1000ML POUR BTL (IV SOLUTION) ×3 IMPLANT
PACK LAMINECTOMY NEURO (CUSTOM PROCEDURE TRAY) ×3 IMPLANT
PAD ARMBOARD 7.5X6 YLW CONV (MISCELLANEOUS) ×3 IMPLANT
SPOGE SURGIFLO 8M (HEMOSTASIS) ×2
SPONGE SURGIFLO 8M (HEMOSTASIS) ×1 IMPLANT
SUT DVC VLOC 3-0 CL 6 P-12 (SUTURE) ×3 IMPLANT
SUT VIC AB 0 CT1 27 (SUTURE) ×2
SUT VIC AB 0 CT1 27XCR 8 STRN (SUTURE) ×1 IMPLANT
SUT VIC AB 2-0 CT1 18 (SUTURE) ×3 IMPLANT
SYR 10ML LL (SYRINGE) ×3 IMPLANT
SYR 20ML LL LF (SYRINGE) ×3 IMPLANT
SYR 30ML LL (SYRINGE) ×6 IMPLANT
SYR 3ML LL SCALE MARK (SYRINGE) ×3 IMPLANT
TOWEL OR 17X26 4PK STRL BLUE (TOWEL DISPOSABLE) ×9 IMPLANT
TUBE MATRX SPINL 18MM 6CM DISP (INSTRUMENTS) ×2
TUBE METRX SPINAL 18X6 DISP (INSTRUMENTS) IMPLANT
TUBING CONNECTING 10 (TUBING) ×2 IMPLANT
TUBING CONNECTING 10' (TUBING) ×1

## 2018-10-26 NOTE — Discharge Summary (Signed)
Procedure: L4-5 lumbar microdiscectomy Procedure date: 10/26/2018 Diagnosis: lumbar radiculopathy  History: Anne Chandler is s/p L4-5 lumbar microdiscectomy for lumbar radiculopathy  POD0: Tolerated procedure well without complication.  Evaluated in postop recovery.  Patient still complains of persistent left lateral thigh pain that remains unchanged following surgical intervention.  However, she does state that the numbness that she was having distal to the knee in the left lower extremity has improved.  Also complains of back pain.  Physical Exam: Vitals:   10/26/18 1658 10/26/18 1713  BP: 116/81 127/85  Pulse: 84 81  Resp: 18 14  Temp:  97.8 F (36.6 C)  SpO2: 97% 98%   AA Ox3 Strength:5/5 throughout lower extremities Sensation: Intact and symmetric throughout lower extremities Skin: Glue intact at incision site.  No active bleeding.  Data:  Recent Labs  Lab 10/21/18 1424  NA 140  K 3.2*  CL 104  CO2 27  BUN 11  CREATININE 0.68  GLUCOSE 89  CALCIUM 9.0   No results for input(s): AST, ALT, ALKPHOS in the last 168 hours.  Invalid input(s): TBILI   Recent Labs  Lab 10/21/18 1424  WBC 10.2  HGB 12.2  HCT 38.0  PLT 260   Recent Labs  Lab 10/21/18 1424  APTT 29  INR 1.0         Other tests/results: No imaging reviewed  Assessment/Plan:  Anne Chandler is POD 0 status post L4-5 lumbar microdiscectomy for lumbar radiculopathy.  Symptoms that were present prior to surgery remained persistent.  Patient presents disappointed but was reassured that symptoms may just take some time to improve.  Reassuring that lower extremity numbness has resolved.  We will continue postop pain control with steroid, gabapentin, Tylenol, oxycodone, and baclofen.  She is scheduled to follow-up in clinic in approximately 2 weeks to monitor progress. Advised to contact office if any questions or concerns arise before then.  Marin Olp PA-C Department of Neurosurgery

## 2018-10-26 NOTE — H&P (Signed)
  I have reviewed and confirmed my history and physical from 09/29/2018 with no additions or changes. Plan for L4-5 microdiscectomy.  Risks and benefits reviewed.  Heart sounds normal no MRG. Chest Clear to Auscultation Bilaterally.

## 2018-10-26 NOTE — Discharge Instructions (Signed)
°Your surgeon has performed an operation on your lumbar spine (low back) to relieve pressure on one or more nerves. Many times, patients feel better immediately after surgery and can “overdo it.” Even if you feel well, it is important that you follow these activity guidelines. If you do not let your back heal properly from the surgery, you can increase the chance of a disc herniation and/or return of your symptoms. The following are instructions to help in your recovery once you have been discharged from the hospital. ° °* Do not take anti-inflammatory medications for 3 days after surgery (naproxen [Aleve], ibuprofen [Advil, Motrin], celecoxib [Celebrex], etc.) ° °Activity  °  °No bending, lifting, or twisting (“BLT”). Avoid lifting objects heavier than 10 pounds (gallon milk jug).  Where possible, avoid household activities that involve lifting, bending, pushing, or pulling such as laundry, vacuuming, grocery shopping, and childcare. Try to arrange for help from friends and family for these activities while your back heals. ° °Increase physical activity slowly as tolerated.  Taking short walks is encouraged, but avoid strenuous exercise. Do not jog, run, bicycle, lift weights, or participate in any other exercises unless specifically allowed by your doctor. Avoid prolonged sitting, including car rides. ° °Talk to your doctor before resuming sexual activity. ° °You should not drive until cleared by your doctor. ° °Until released by your doctor, you should not return to work or school.  You should rest at home and let your body heal.  ° °You may shower two days after your surgery.  After showering, lightly dab your incision dry. Do not take a tub bath or go swimming for 3 weeks, or until approved by your doctor at your follow-up appointment. ° °If you smoke, we strongly recommend that you quit.  Smoking has been proven to interfere with normal healing in your back and will dramatically reduce the success rate of  your surgery. Please contact [facility] (800-QUIT-NOW) and use the resources at www.[facility].com for assistance in stopping smoking. ° °Surgical Incision °  °If you have a dressing on your incision, you may remove it three days after your surgery. Keep your incision area clean and dry. ° °If you have staples or stitches on your incision, you should have a follow up scheduled for removal. If you do not have staples or stitches, you will have steri-strips (small pieces of surgical tape) or Dermabond glue. The steri-strips/glue should begin to peel away within about a week (it is fine if the steri-strips fall off before then). If the strips are still in place one week after your surgery, you may gently remove them. ° °Diet          ° ° You may return to your usual diet. Be sure to stay hydrated. ° °When to Contact Us ° °Although your surgery and recovery will likely be uneventful, you may have some residual numbness, aches, and pains in your back and/or legs. This is normal and should improve in the next few weeks. ° °However, should you experience any of the following, contact us immediately: °• New numbness or weakness °• Pain that is progressively getting worse, and is not relieved by your pain medications or rest °• Bleeding, redness, swelling, pain, or drainage from surgical incision °• Chills or flu-like symptoms °• Fever greater than 101.0 F (38.3 C) °• Problems with bowel or bladder functions °• Difficulty breathing or shortness of breath °• Warmth, tenderness, or swelling in your calf ° °Contact Information °• During office hours (Monday-Friday   9 am to 5 pm), please call your physician at [phone]  After hours and weekends, please call the [place] Operator at [phone] and ask for the Neurosurgery Resident On Call   For a life-threatening emergency, call [place]   1) The drugs that you were given will stay in your system until tomorrow so for the  next 24 hours you should not:  A) Drive an automobile B) Make any legal decisions C) Drink any alcoholic beverage   2) You may resume regular meals tomorrow.  Today it is better to start with liquids and gradually work up to solid foods.  You may eat anything you prefer, but it is better to start with liquids, then soup and crackers, and gradually work up to solid foods.   3) Please notify your doctor immediately if you have any unusual bleeding, trouble breathing, redness and pain at the surgery site, drainage, fever, or pain not relieved by medication. 4)   5) Your post-operative visit with Dr.                                     is: Date:                        Time:    Please call to schedule your post-operative visit.  6) Additional Instructions:

## 2018-10-26 NOTE — Anesthesia Post-op Follow-up Note (Signed)
Anesthesia QCDR form completed.        

## 2018-10-26 NOTE — Op Note (Signed)
Indications: [name] is a 38 yo female who presented with lumbar radiculopathy.  She failed conservative management and elected for surgery.  Findings: disc herniation L4-5  Preoperative Diagnosis: Lumbar radiculopathy Postoperative Diagnosis: same   EBL: 20 ml IVF: 700 ml Drains: none Disposition: Extubated and Stable to PACU Complications: none  No foley catheter was placed.   Preoperative Note:   Risks of surgery discussed include: infection, bleeding, stroke, coma, death, paralysis, CSF leak, nerve/spinal cord injury, numbness, tingling, weakness, complex regional pain syndrome, recurrent stenosis and/or disc herniation, vascular injury, development of instability, neck/back pain, need for further surgery, persistent symptoms, development of deformity, and the risks of anesthesia. The patient understood these risks and agreed to proceed.  Operative Note:   1) Left L4/5 microdiscectomy  The patient was then brought from the preoperative center with intravenous access established.  The patient underwent general anesthesia and endotracheal tube intubation, and was then rotated on the [place] rail top where all pressure points were appropriately padded.  The skin was then thoroughly cleansed.  Perioperative antibiotic prophylaxis was administered.  Sterile prep and drapes were then applied and a timeout was then observed.  C-arm was brought into the field under sterile conditions, and the L4-5 disc space identified and marked with an incision on the left 1cm lateral to midline.  Once this was complete a 2 cm incision was opened with the use of a #10 blade knife.  The Metrx tubes were sequentially advanced under lateral fluoroscopy until a 18 x 60 mm Metrx tube was placed over the facet and lamina and secured to the bed.    The microscope was then sterilely brought into the field and muscle creep was hemostased with a bipolar and resected with a pituitary rongeur.  A Bovie extender was  then used to expose the spinous process and lamina.  Careful attention was placed to not violate the facet capsule. A 3 mm matchstick drill bit was then used to make a hemi-laminotomy trough until the ligamentum flavum was exposed.  This was extended to the base of the spinous process.  Once this was complete and the underlying ligamentum flavum was visualized this was dissected with an up angle curette and resected with a #2 and #3 mm biting Kerrison.  The laminotomy opening was also expanded in similar fashion and hemostasis was obtained with Surgifoam and a patty as well as bone wax.  The rostral aspect of the caudal level of the lamina was also resected with a #2 biting Kerrison effort to further enhance exposure.  Once the underlying dura was visualized a Penfield 4 was then used to dissect and expose the traversing nerve root.  Once this was identified a nerve root retractor suction was used to mobilize this medially.  The venous plexus was hemostased with Surgifoam and light bipolar use.  A small penfield 4 was then used to make a small annulotomy within the disc space and disc space contents were noted to come through the annulus.    The disc herniation was identified and dissected free using a balltip probe. The pituitary rongeur was used to remove the extruded disc fragments. Once the thecal sac and nerve root were noted to be relaxed and under less tension the ball-tipped feeler was passed along the foramen distally to to ensure no residual compression was noted.    Depo-Medrol was placed along the nerve root.  The area was irrigated. The tube system was then removed under microscopic visualization and hemostasis was  obtained with a bipolar.    The fascial layer was reapproximated with the use of a 0- Vicryl suture.  Subcutaneous tissue layer was reapproximated using 2-0 Vicryl suture.  3-0 monocryl was used on the skin. The skin was then cleansed and Dermabond was used to close the skin opening.   Patient was then rotated back to the preoperative bed awakened from anesthesia and taken to recovery all counts are correct in this case.   I performed the entire procedure with the assistance of Ivar DrapeAmanda Ferri PA as an Designer, television/film setassistant surgeon.  Venetia Nighthester Christophe Rising MD

## 2018-10-26 NOTE — Anesthesia Preprocedure Evaluation (Signed)
Anesthesia Evaluation  Patient identified by MRN, date of birth, ID band Patient awake    Reviewed: Allergy & Precautions, NPO status , Patient's Chart, lab work & pertinent test results  History of Anesthesia Complications Negative for: history of anesthetic complications  Airway Mallampati: II       Dental   Pulmonary neg sleep apnea, neg COPD, Current Smoker,           Cardiovascular (-) hypertension(-) Past MI and (-) CHF (-) dysrhythmias (-) Valvular Problems/Murmurs     Neuro/Psych neg Seizures    GI/Hepatic Neg liver ROS, neg GERD  ,  Endo/Other  neg diabetes  Renal/GU negative Renal ROS     Musculoskeletal   Abdominal   Peds  Hematology   Anesthesia Other Findings   Reproductive/Obstetrics                            Anesthesia Physical Anesthesia Plan  ASA: II  Anesthesia Plan: General   Post-op Pain Management:    Induction: Intravenous  PONV Risk Score and Plan: 2 and Dexamethasone and Ondansetron  Airway Management Planned: Oral ETT  Additional Equipment:   Intra-op Plan:   Post-operative Plan:   Informed Consent: I have reviewed the patients History and Physical, chart, labs and discussed the procedure including the risks, benefits and alternatives for the proposed anesthesia with the patient or authorized representative who has indicated his/her understanding and acceptance.     Plan Discussed with:   Anesthesia Plan Comments:         Anesthesia Quick Evaluation  

## 2018-10-26 NOTE — Transfer of Care (Signed)
Immediate Anesthesia Transfer of Care Note  Patient: Anne Chandler  Procedure(s) Performed: LEFT L4-5 MICRODISCECTOMY (Left )  Patient Location: PACU  Anesthesia Type:General  Level of Consciousness: awake, alert  and oriented  Airway & Oxygen Therapy: Patient Spontanous Breathing and Patient connected to face mask oxygen  Post-op Assessment: Report given to RN and Post -op Vital signs reviewed and stable  Post vital signs: Reviewed and stable  Last Vitals:  Vitals Value Taken Time  BP 124/80 10/26/18 1613  Temp    Pulse 88 10/26/18 1613  Resp 15 10/26/18 1613  SpO2 99 % 10/26/18 1613  Vitals shown include unvalidated device data.  Last Pain:  Vitals:   10/26/18 1611  TempSrc: Temporal  PainSc: 0-No pain         Complications: No apparent anesthesia complications

## 2018-10-26 NOTE — Anesthesia Procedure Notes (Signed)
Procedure Name: Intubation Date/Time: 10/26/2018 2:25 PM Performed by: Cory Munch, RN Pre-anesthesia Checklist: Patient identified, Patient being monitored, Timeout performed, Emergency Drugs available and Suction available Patient Re-evaluated:Patient Re-evaluated prior to induction Oxygen Delivery Method: Circle system utilized Preoxygenation: Pre-oxygenation with 100% oxygen Induction Type: IV induction Ventilation: Mask ventilation without difficulty Laryngoscope Size: Mac and 3 Grade View: Grade I Tube type: Oral Tube size: 7.0 mm Number of attempts: 1 Airway Equipment and Method: Stylet Placement Confirmation: ETT inserted through vocal cords under direct vision,  positive ETCO2 and breath sounds checked- equal and bilateral Secured at: 21 cm Tube secured with: Tape Dental Injury: Teeth and Oropharynx as per pre-operative assessment

## 2018-10-28 NOTE — Anesthesia Postprocedure Evaluation (Signed)
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  Procedure(s) Performed: LEFT L4-5 MICRODISCECTOMY (Left )  Patient location during evaluation: PACU Anesthesia Type: General Level of consciousness: awake and alert Pain management: pain level controlled Vital Signs Assessment: post-procedure vital signs reviewed and stable Respiratory status: spontaneous breathing and respiratory function stable Cardiovascular status: stable Anesthetic complications: no     Last Vitals:  Vitals:   10/26/18 1813 10/26/18 1815  BP: (!) 119/101 125/79  Pulse: 70   Resp: 16   Temp:    SpO2: 100%     Last Pain:  Vitals:   10/26/18 1813  TempSrc:   PainSc: 3                  Anne Chandler K

## 2018-11-02 ENCOUNTER — Encounter: Payer: Self-pay | Admitting: Student in an Organized Health Care Education/Training Program

## 2018-11-03 ENCOUNTER — Ambulatory Visit
Payer: Medicaid Other | Attending: Student in an Organized Health Care Education/Training Program | Admitting: Student in an Organized Health Care Education/Training Program

## 2018-11-03 ENCOUNTER — Other Ambulatory Visit: Payer: Self-pay

## 2018-11-03 ENCOUNTER — Encounter: Payer: Self-pay | Admitting: Student in an Organized Health Care Education/Training Program

## 2018-11-03 DIAGNOSIS — M5416 Radiculopathy, lumbar region: Secondary | ICD-10-CM | POA: Diagnosis not present

## 2018-11-03 NOTE — Progress Notes (Signed)
Pain Management Virtual Encounter Note - Virtual Visit via Telephone Telehealth (real-time audio visits between healthcare provider and patient).   Patient's Phone No. & Preferred Pharmacy:  [phone] (home); [phone] (mobile); (Preferred) [phone] [email or web address]  [id number] - [place], [place] - [address] [facility] [address] [address] [facility] [address] [place] [address] Phone: [phone] Fax: [phone]  [id number] - [place], [place] - [address] [place] [address] Phone: [phone] Fax: [phone]    Pre-screening note:  Our staff contacted Anne Chandler and offered her an "in person", "face-to-face" appointment versus a telephone encounter. She indicated preferring the telephone encounter, at this time.   Reason for Virtual Visit: COVID-19*  Social distancing based on CDC and AMA recommendations.   I contacted [employer] on 11/03/2018 via telephone.      I clearly identified myself as Edward Jolly, MD. I verified that I was speaking with the correct person using two identifiers (Name: Anne Chandler, and date of birth: 1980-08-04).  Advanced Informed Consent I sought verbal advanced consent from Anne Chandler for virtual visit interactions. I informed Anne Chandler of possible security and privacy concerns, risks, and limitations associated with providing "not-in-person" medical evaluation and management services. I also informed Anne Chandler of the availability of "in-person" appointments. Finally, I informed her that there would be a charge for the virtual visit and that she could be  personally, fully or partially, financially responsible for it. Anne Chandler expressed understanding and agreed to proceed.   Historic Elements   Ms. Stpehanie Cresenciano Chandler is a 38 y.o. year old, female patient evaluated today after her last encounter by our practice on 10/20/2018. Anne Chandler  has no past medical history on  file. She also  has a past surgical history that includes Tubal ligation and Lumbar laminectomy/decompression microdiscectomy (Left, 10/26/2018). Anne Chandler has a current medication list which includes the following prescription(s): baclofen, gabapentin, and methylprednisolone. She  reports that she has been smoking cigarettes. She has been smoking about 0.50 packs per day. She uses smokeless tobacco. She reports current alcohol use. She reports that she does not use drugs. Anne Chandler has No Known Allergies.   HPI  Today, she is being contacted for a post-procedure assessment.   Postprocedural follow-up status post lumbar ESI #2 on 09/28/2018.  Patient states that L4-L5 ESI was helpful for approximately 2 weeks.  Patient went on to have left L4-L5 microdiscectomy on 10/26/2018.  She states that she is healing well from that.  She continues to endorse intermittent left hip and left knee pain.  I told the patient that she is very early in her recovery course and that this will hopefully improve over time.  I instructed her to keep her follow-up with Dr. Marcell Barlow for post surgery check.  Patient endorsed understanding.  Laboratory Chemistry Profile (12 mo)  Renal: 10/21/2018: BUN 11; Creatinine, Ser 0.68  Lab Results  Component Value Date   GFRAA >60 10/21/2018   GFRNONAA >60 10/21/2018   Hepatic: No results found for requested labs within last 8760 hours. No results found for: AST, ALT Other: No results found for requested labs within last 8760 hours. Note: Above Lab results reviewed.  Imaging  Last 90 days:  Dg Lumbar Spine 2-3 Views  Result Date: 10/26/2018 CLINICAL DATA:  Spine surgery EXAM: DG C-ARM 1-60 MIN; LUMBAR SPINE - 2-3 VIEW CONTRAST:  None FLUOROSCOPY TIME:  Fluoroscopy Time:  3 seconds  Number of Acquired Spot Images: 3 COMPARISON:  09/25/2018 FINDINGS: Three low resolution intraoperative spot views of the lumbar spine. Surgical localizing instruments overlying posterior elements at  what appears to be L4-L5 level. Partially visualized metallic clamp left side of images. IMPRESSION: Intraoperative fluoroscopic assistance provided during lumbar spine surgery. Electronically Signed   By: Donavan Foil M.D.   On: 10/26/2018 15:14   Dg Pain Clinic C-arm 1-60 Min No Report  Result Date: 09/28/2018 Fluoro was used, but no Radiologist interpretation will be provided. Please refer to "NOTES" tab for provider progress note.  Dg Pain Clinic C-arm 1-60 Min No Report  Result Date: 09/05/2018 Fluoro was used, but no Radiologist interpretation will be provided. Please refer to "NOTES" tab for provider progress note.  Dg C-arm 1-60 Min  Result Date: 10/26/2018 CLINICAL DATA:  Spine surgery EXAM: DG C-ARM 1-60 MIN; LUMBAR SPINE - 2-3 VIEW CONTRAST:  None FLUOROSCOPY TIME:  Fluoroscopy Time:  3 seconds Number of Acquired Spot Images: 3 COMPARISON:  09/25/2018 FINDINGS: Three low resolution intraoperative spot views of the lumbar spine. Surgical localizing instruments overlying posterior elements at what appears to be L4-L5 level. Partially visualized metallic clamp left side of images. IMPRESSION: Intraoperative fluoroscopic assistance provided during lumbar spine surgery. Electronically Signed   By: Donavan Foil M.D.   On: 10/26/2018 15:14    Assessment  The primary encounter diagnosis was Lumbar radiculopathy. A diagnosis of Lumbar radicular syndrome was also pertinent to this visit.  Plan of Care  I am having Kynedi Chandler maintain her gabapentin, methylPREDNISolone, and baclofen.  Status post L4-L5 microdiscectomy on 10/26/2018, recovering well from surgery..  Patient saw me last month for lumbar epidural steroid injection #2.  Instructed patient to follow-up PRN.  Follow-up plan:   Return if symptoms worsen or fail to improve.     Status post left L4-L5 ESI 09/05/2018- helped for 3.5 weeks with sudden and severe return of pain without inciting event. Repeat L4-L5 ESI #2 on  09/28/2018- helped for 2 weeks.    Recent Visits Date Type Provider Dept  09/28/18 Procedure visit Gillis Santa, MD [facility]  09/05/18 Procedure visit Gillis Santa, MD [facility]  08/31/18 Office Visit Gillis Santa, MD [facility]  Showing recent visits within past 90 days and meeting all other requirements   Today's Visits Date Type Provider Dept  11/03/18 Office Visit Gillis Santa, MD [facility]  Showing today's visits and meeting all other requirements   Future Appointments No visits were found meeting these conditions.  Showing future appointments within next 90 days and meeting all other requirements   I discussed the assessment and treatment plan with the patient. The patient was provided an opportunity to ask questions and all were answered. The patient agreed with the plan and demonstrated an understanding of the instructions.  Patient advised to call back or seek an in-person evaluation if the symptoms or condition worsens.  Total duration of non-face-to-face encounter:12 minutes.  Note by: Gillis Santa, MD Date: 11/03/2018; Time: 9:22 AM  Note: This dictation was prepared with Dragon dictation. Any transcriptional errors that may result from this process are unintentional.  Disclaimer:  * Given the special circumstances of the COVID-19 pandemic, the federal government has announced that the Office for Civil Rights (OCR) will exercise its enforcement discretion and will not impose penalties on physicians using telehealth in the event of noncompliance with regulatory requirements under the [place] and [place] (HIPAA) in connection with  the good faith provision of telehealth during the VFMBB-40 national public health emergency. (AMA)

## 2018-11-10 ENCOUNTER — Ambulatory Visit: Payer: Medicaid Other | Admitting: Psychiatry

## 2018-11-24 NOTE — Progress Notes (Signed)
"  ° ° °  REFERRING PHYSICIAN:  Clois Reeves [place], Md [address] [facility] [place],  [place] [address]  Procedure: Left L4-5 microdiscectomy Date of Procedure: 10/26/2018 Diagnosis: Lumbar radiculopathy  HISTORY OF PRESENT ILLNESS: Anne Chandler is approximately 4 weeks status post left L4-5 microdiscectomy for lumbar radiculopathy. She had relief of her pain on postoperative day 1, but has had slow worsening of her pain since then.  She does report that her pain is substantially improved compared to preop.  She continues to have numbness in her left L5 distribution, though it is better than before surgery.  She is okay when she walks around but has significant discomfort when she stands in 1 position for any length of time. PHYSICAL EXAMINATION:  There were no vitals taken for this visit. There is no height or weight on file to calculate BMI. There is no height or weight on file to calculate BSA. General: Patient is well-developed, well-nourished, and in no apparent distress  Neurological:  Awake, alert, oriented to person, place, and time.  Facial tone is symmetric.    Strength: Side Iliopsoas Quads Hamstring Plantar Flexion Dorsiflexion  Extensor Hallicus Longus  R 5 5 5 5 5 5   L 5 5 5 5 5 5    Sensory: Decreased sensation left lateral calf Skin:  Incision site is healing well.  IMAGING: No imaging reviewed  ASSESSMENT/PLAN:  Anne Chandler is doing well approximately 4 weeks s/p L4-5 microdiscectomy.   She has some persistent signs of radiculopathy.  She may be transition to a chronic radiculopathy or just taking longer for improvement.  I encouraged her to continue her medications.  If she is still in the situation in 6 weeks, we will reimage her with a lumbar spine MRI and send her for nerve conduction study.  I will also give her a Medrol  Dosepak and a prescription for physical therapy.     Reeves Clois MD, [facility] Department of Neurosurgery   "

## 2021-04-29 IMAGING — RF DG LUMBAR SPINE 2-3V
1 series · 3 of 3 positions shown · IV contrast (agent unspecified)
Comparison: 09/25/2018

CLINICAL DATA: Spine surgery

EXAM:
DG C-ARM 1-60 MIN; LUMBAR SPINE - 2-3 VIEW
CONTRAST:  None
FLUOROSCOPY TIME:  Fluoroscopy Time:  3 seconds
Number of Acquired Spot Images: 3

[Series 1: unknown protocol · 0.14mm/px · 3 of 3 slices shown]
[im 1/3]
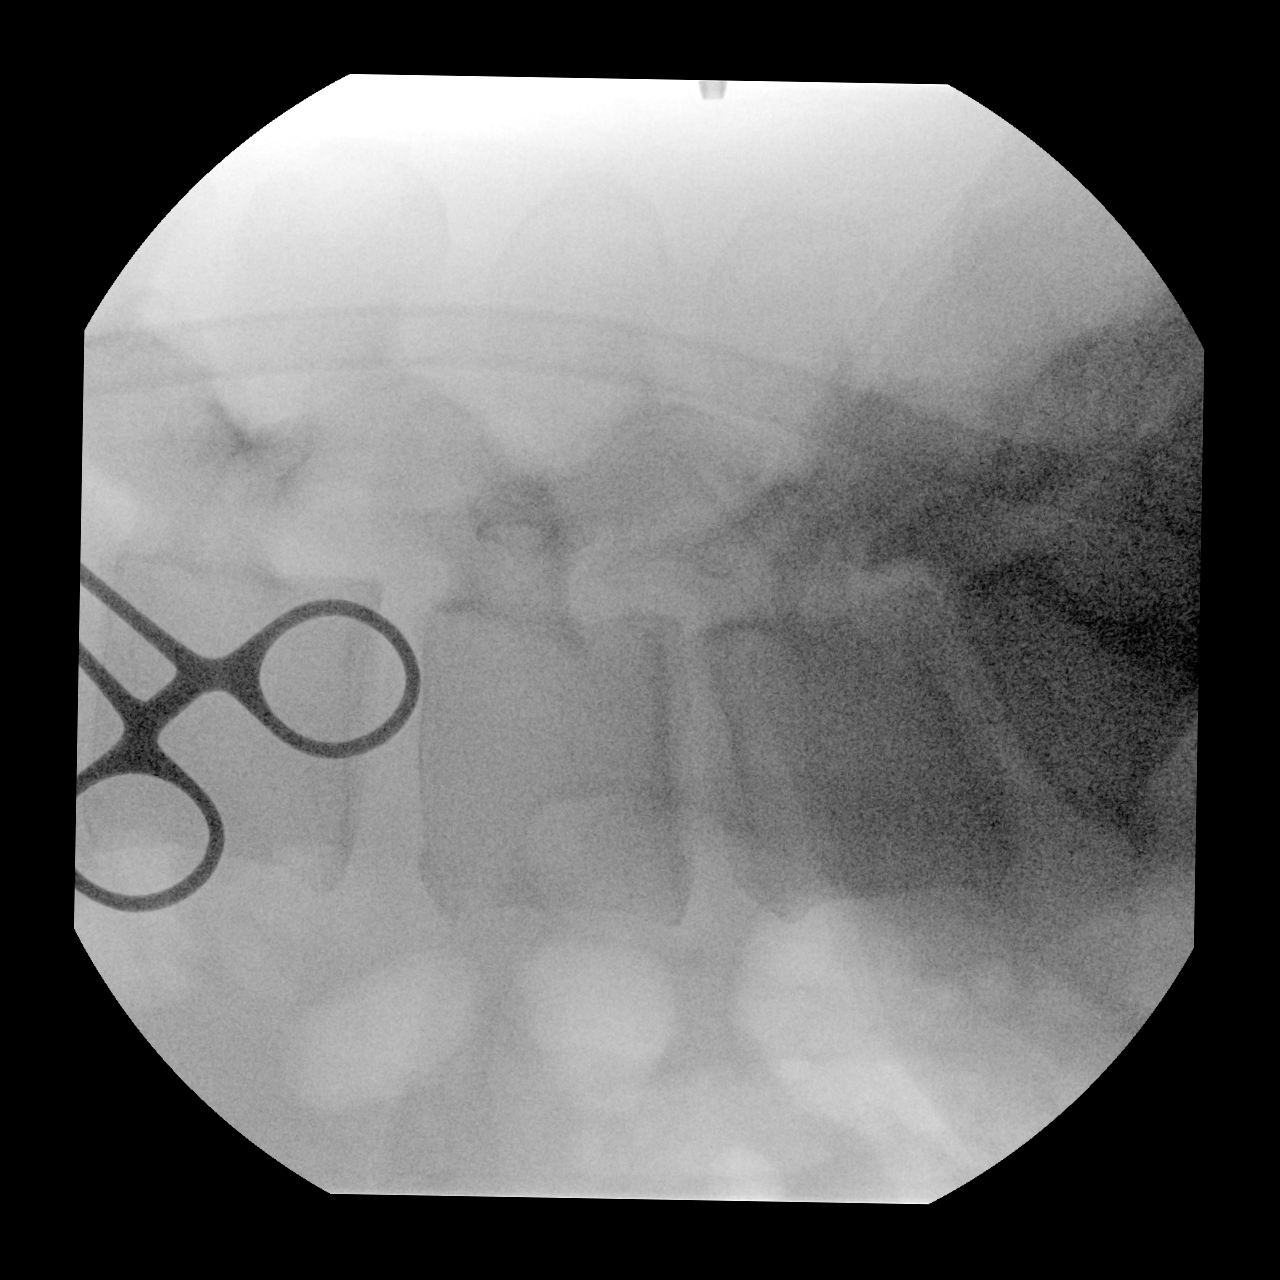
[im 2/3]
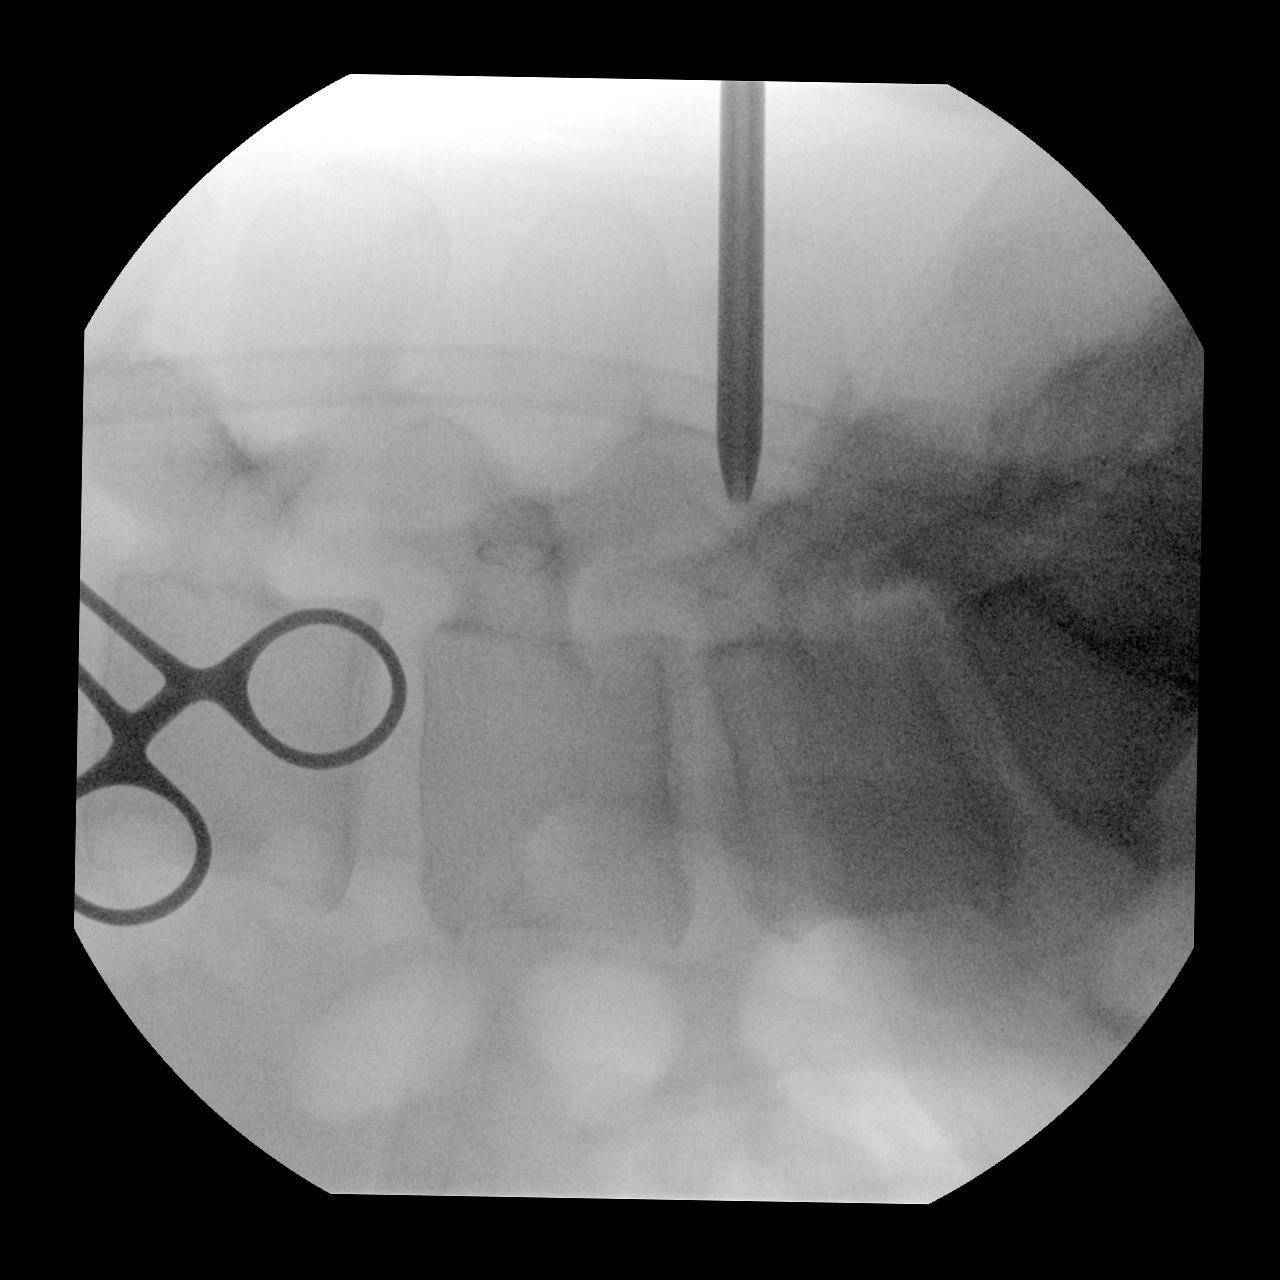
[im 3/3]
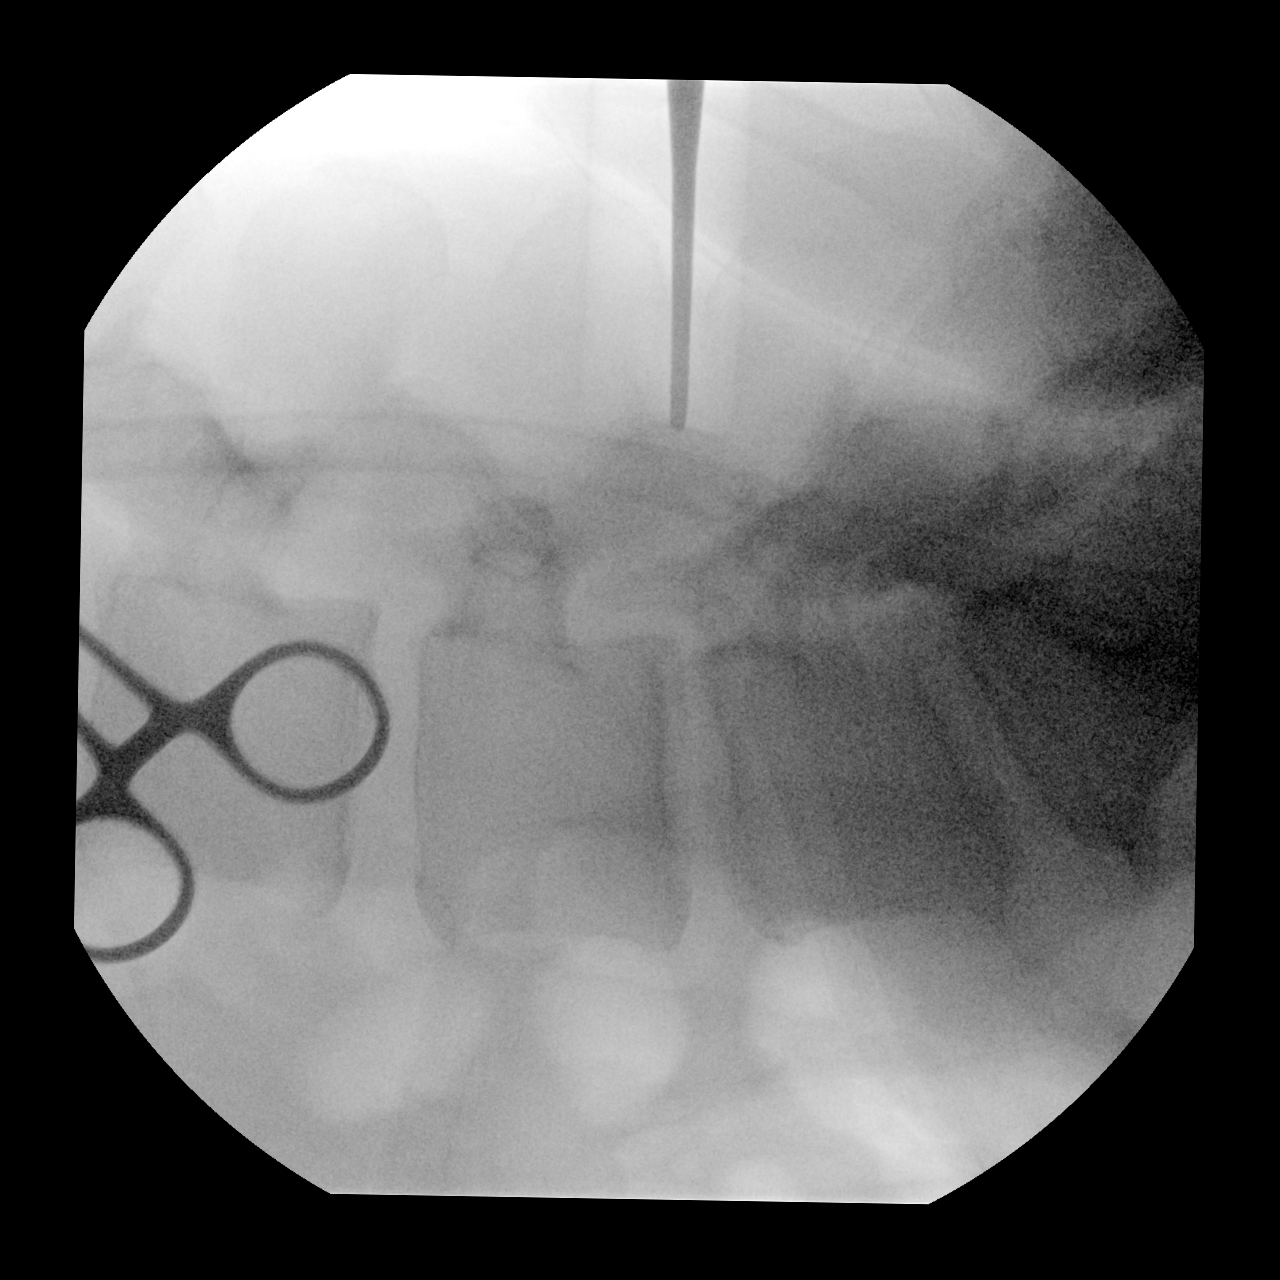

[3 of 3 positions shown; findings below may reference images not displayed]

FINDINGS: Three low resolution intraoperative spot views of the lumbar spine.
Surgical localizing instruments overlying posterior elements at what
appears to be L4-L5 level. Partially visualized metallic clamp left
side of images.
IMPRESSION: Intraoperative fluoroscopic assistance provided during lumbar spine
surgery.

## 2021-11-06 ENCOUNTER — Other Ambulatory Visit: Payer: Self-pay | Admitting: Family Medicine

## 2021-11-06 DIAGNOSIS — N6310 Unspecified lump in the right breast, unspecified quadrant: Secondary | ICD-10-CM

## 2021-11-17 ENCOUNTER — Ambulatory Visit
Admission: RE | Admit: 2021-11-17 | Discharge: 2021-11-17 | Disposition: A | Discharge status: Home / Self Care | Payer: BC Managed Care – PPO | Source: Ambulatory Visit | Attending: Family Medicine | Admitting: Family Medicine

## 2021-11-17 ENCOUNTER — Other Ambulatory Visit: Payer: Self-pay | Admitting: Family Medicine

## 2021-11-17 ENCOUNTER — Ambulatory Visit
Admission: RE | Admit: 2021-11-17 | Discharge: 2021-11-17 | Disposition: A | Discharge status: Home / Self Care | Payer: Medicaid Other | Source: Ambulatory Visit | Attending: Family Medicine | Admitting: Family Medicine

## 2021-11-17 DIAGNOSIS — N6310 Unspecified lump in the right breast, unspecified quadrant: Secondary | ICD-10-CM

## 2021-11-17 DIAGNOSIS — R921 Mammographic calcification found on diagnostic imaging of breast: Secondary | ICD-10-CM

## 2021-11-21 ENCOUNTER — Other Ambulatory Visit: Payer: Self-pay | Admitting: Family Medicine

## 2021-11-21 ENCOUNTER — Ambulatory Visit
Admission: RE | Admit: 2021-11-21 | Discharge: 2021-11-21 | Disposition: A | Discharge status: Home / Self Care | Payer: BC Managed Care – PPO | Source: Ambulatory Visit | Attending: Family Medicine | Admitting: Family Medicine

## 2021-11-21 ENCOUNTER — Other Ambulatory Visit (HOSPITAL_COMMUNITY)
Admission: RE | Admit: 2021-11-21 | Discharge: 2021-11-21 | Disposition: A | Discharge status: Home / Self Care | Payer: BC Managed Care – PPO | Source: Ambulatory Visit | Attending: Diagnostic Radiology | Admitting: Diagnostic Radiology

## 2021-11-21 DIAGNOSIS — N6310 Unspecified lump in the right breast, unspecified quadrant: Secondary | ICD-10-CM | POA: Diagnosis present

## 2021-11-24 NOTE — Progress Notes (Signed)
 Patient presents with complaints of menopausal symptoms. Complaints of increased anxiety and depression. Reports that she feels very emotional, her mood can change with in seconds. Reports that she does not feel like herself anymore. She was recently put on Prozac in September. States that she feels like this has helped some but she still doesn't feel like herself. Complaints of night sweats.

## 2021-11-27 LAB — CYTOLOGY - NON PAP

## 2022-05-11 ENCOUNTER — Inpatient Hospital Stay: Admission: RE | Admit: 2022-05-11 | Payer: BC Managed Care – PPO | Source: Ambulatory Visit

## 2024-02-02 ENCOUNTER — Encounter (INDEPENDENT_AMBULATORY_CARE_PROVIDER_SITE_OTHER): Payer: Self-pay | Admitting: *Deleted
